# Patient Record
Sex: Female | Born: 1955 | Race: Black or African American | Hispanic: No | Marital: Single | Smoking: Former smoker
Health system: Southern US, Community
[De-identification: ages and names within clinical notes are randomized; demographics above are authoritative.]

## PROBLEM LIST (undated history)

## (undated) DIAGNOSIS — R51 Headache: Secondary | ICD-10-CM

## (undated) DIAGNOSIS — M8580 Other specified disorders of bone density and structure, unspecified site: Secondary | ICD-10-CM

## (undated) DIAGNOSIS — E7800 Pure hypercholesterolemia, unspecified: Secondary | ICD-10-CM

## (undated) DIAGNOSIS — I10 Essential (primary) hypertension: Secondary | ICD-10-CM

## (undated) HISTORY — DX: Headache: R51

## (undated) HISTORY — DX: Pure hypercholesterolemia, unspecified: E78.00

## (undated) HISTORY — PX: OOPHORECTOMY: SHX86

## (undated) HISTORY — DX: Essential (primary) hypertension: I10

## (undated) HISTORY — DX: Other specified disorders of bone density and structure, unspecified site: M85.80

---

## 1999-05-15 ENCOUNTER — Encounter: Payer: Self-pay | Admitting: Gynecology

## 1999-05-16 ENCOUNTER — Ambulatory Visit (HOSPITAL_COMMUNITY): Admission: RE | Admit: 1999-05-16 | Discharge: 1999-05-16 | Payer: Self-pay | Admitting: Gynecology

## 2000-12-31 ENCOUNTER — Encounter: Payer: Self-pay | Admitting: Family Medicine

## 2000-12-31 ENCOUNTER — Ambulatory Visit (HOSPITAL_COMMUNITY): Admission: RE | Admit: 2000-12-31 | Discharge: 2000-12-31 | Payer: Self-pay | Admitting: Family Medicine

## 2001-03-18 ENCOUNTER — Encounter: Payer: Self-pay | Admitting: Gynecology

## 2001-03-24 ENCOUNTER — Inpatient Hospital Stay (HOSPITAL_COMMUNITY): Admission: RE | Admit: 2001-03-24 | Discharge: 2001-03-27 | Payer: Self-pay | Admitting: Gynecology

## 2001-03-24 ENCOUNTER — Encounter (INDEPENDENT_AMBULATORY_CARE_PROVIDER_SITE_OTHER): Payer: Self-pay

## 2001-03-24 HISTORY — PX: TOTAL ABDOMINAL HYSTERECTOMY: SHX209

## 2001-04-15 ENCOUNTER — Ambulatory Visit (HOSPITAL_COMMUNITY): Admission: RE | Admit: 2001-04-15 | Discharge: 2001-04-15 | Payer: Self-pay | Admitting: Family Medicine

## 2001-04-15 ENCOUNTER — Encounter: Payer: Self-pay | Admitting: Family Medicine

## 2003-01-12 ENCOUNTER — Other Ambulatory Visit: Admission: RE | Admit: 2003-01-12 | Discharge: 2003-01-12 | Payer: Self-pay | Admitting: Gynecology

## 2004-05-09 ENCOUNTER — Other Ambulatory Visit: Admission: RE | Admit: 2004-05-09 | Discharge: 2004-05-09 | Payer: Self-pay | Admitting: Gynecology

## 2004-10-05 ENCOUNTER — Ambulatory Visit (HOSPITAL_COMMUNITY): Admission: RE | Admit: 2004-10-05 | Discharge: 2004-10-05 | Payer: Self-pay | Admitting: Neurology

## 2004-10-17 ENCOUNTER — Ambulatory Visit (HOSPITAL_COMMUNITY): Admission: RE | Admit: 2004-10-17 | Discharge: 2004-10-17 | Payer: Self-pay | Admitting: Neurology

## 2004-10-26 ENCOUNTER — Encounter (INDEPENDENT_AMBULATORY_CARE_PROVIDER_SITE_OTHER): Payer: Self-pay | Admitting: Cardiology

## 2004-10-26 ENCOUNTER — Ambulatory Visit (HOSPITAL_COMMUNITY): Admission: RE | Admit: 2004-10-26 | Discharge: 2004-10-26 | Payer: Self-pay | Admitting: Cardiology

## 2004-11-06 ENCOUNTER — Inpatient Hospital Stay (HOSPITAL_COMMUNITY): Admission: EM | Admit: 2004-11-06 | Discharge: 2004-11-09 | Payer: Self-pay | Admitting: Emergency Medicine

## 2004-11-20 ENCOUNTER — Encounter: Admission: RE | Admit: 2004-11-20 | Discharge: 2004-11-20 | Payer: Self-pay | Admitting: Neurology

## 2005-01-25 HISTORY — PX: CARDIAC SURGERY: SHX584

## 2005-10-17 ENCOUNTER — Other Ambulatory Visit: Admission: RE | Admit: 2005-10-17 | Discharge: 2005-10-17 | Payer: Self-pay | Admitting: Gynecology

## 2006-10-24 ENCOUNTER — Other Ambulatory Visit: Admission: RE | Admit: 2006-10-24 | Discharge: 2006-10-24 | Payer: Self-pay | Admitting: Gynecology

## 2007-03-06 ENCOUNTER — Inpatient Hospital Stay (HOSPITAL_COMMUNITY): Admission: EM | Admit: 2007-03-06 | Discharge: 2007-03-07 | Payer: Self-pay | Admitting: Emergency Medicine

## 2007-10-26 ENCOUNTER — Other Ambulatory Visit: Admission: RE | Admit: 2007-10-26 | Discharge: 2007-10-26 | Payer: Self-pay | Admitting: Gynecology

## 2008-04-24 ENCOUNTER — Emergency Department (HOSPITAL_COMMUNITY): Admission: EM | Admit: 2008-04-24 | Discharge: 2008-04-24 | Payer: Self-pay | Admitting: Emergency Medicine

## 2010-06-13 ENCOUNTER — Ambulatory Visit: Payer: Self-pay | Admitting: Women's Health

## 2010-06-13 ENCOUNTER — Other Ambulatory Visit: Admission: RE | Admit: 2010-06-13 | Discharge: 2010-06-13 | Payer: Self-pay | Admitting: Gynecology

## 2011-03-25 ENCOUNTER — Ambulatory Visit (INDEPENDENT_AMBULATORY_CARE_PROVIDER_SITE_OTHER): Payer: Managed Care, Other (non HMO) | Admitting: Women's Health

## 2011-03-25 DIAGNOSIS — B373 Candidiasis of vulva and vagina: Secondary | ICD-10-CM

## 2011-03-25 DIAGNOSIS — N898 Other specified noninflammatory disorders of vagina: Secondary | ICD-10-CM

## 2011-04-26 NOTE — Op Note (Signed)
[facility]  Patient:    Melissa Duran Visit Number: [id number] MRN: [id number]          Service Type: DSU Location: [facility] Attending Physician:  Tonye Royalty Proc. Date: 05/16/99 Admit Date:  05/16/1999                             Operative Report  PREOPERATIVE DIAGNOSIS:       Request for elective permanent sterilization.  POSTOPERATIVE DIAGNOSIS:      Request for elective permanent sterilization.  OPERATION:                    Laparoscopic tubal ligation, Hulka clip technique.  SURGEON:                      Juan H. Lily Peer, M.D.  ASSISTANT:  ANESTHESIA:                   General endotracheal anesthesia.  FINDINGS:                     Several intramural and subserosal leiomyomas were  noted.  Normal tubes and ovaries.  ESTIMATED BLOOD LOSS:  INDICATIONS:                  A 55 year old, gravida 1, para 1, with request for elective permanent sterilization.  The patient was previously provided with literature information from the [employer] of OB/GYN on laparoscopic sterilization procedure.  Pros and cons, risks, and failure rates were discussed and all questions have been answered.  The patient understood that this was a permanent sterilization and all questions were answered.  DESCRIPTION OF PROCEDURE:     After the patient was adequately counseled, she was taken to the operating room where the patient underwent a successful general endotracheal anesthesia placement.  She was placed in the low lithotomy position and the abdomen, vagina, and perineum were prepped and draped in the usual sterile fashion.  A red rubber Roxan Hockey had previously been inserted to empty her bladder for approximately 50 cc.  An examination under anesthesia demonstrated a slightly irregular uterus, but was anteverted.  A Hulka tenaculum was placed for manipulation during laparoscopic procedure.  After this, a small stab  incision as made in the subumbilical region followed by insertion of the Veress needle. Opening intra-abdominal pressure was 2 mmHg and approximately 3 liters of carbon dioxide were insufflated into the peritoneal cavity.  After this, the Veress needle was removed.  A 10 mm trocar was then inserted into the abdominal cavity under laparoscopic visualization.  A 5 mm trocar was inserted 2 cm above the symphysis pubis whereby a self-retaining retractor was introduced.  After a systematic inspection of the pelvic cavity with above mentioned findings, attention was then placed in the distal portion of the right fallopian tube and the proximal 1/3 portion was identified and a Hulka clip was placed to completely occlude a segment of the fallopian tube.  A similar procedure was carried out on the contralateral side without any complications.  The liver appeared smooth.  No perihepatic adhesions were noted.  The appendix was not identified, possibly because it could have been retrocecal.  The CO2 was removed from the intra-abdominal cavity. The instruments were then removed.  The 10 mm trocar site fascia was reapproximated  with a running stitch of 3-0 Vicryl suture.  The skin was  reapproximated with a  subcuticular stitch of 4-0 plain catgut suture and steri-stripped.  The 5 mm trocar site incision was closed with interrupted sutures of 4-0 plain catgut suture. or postoperative analgesia 0.25% Marcaine was infiltrated in both incision sites for a total of 10 cc.  The Hulka tenaculum was removed and the patient was transferred to the recovery room with stable vital signs.  Estimated blood loss was minimal. Fluid resuscitation consisted of 1200 cc of Ringers lactate. Attending Physician:  Tonye Royalty DD:  05/16/99 TD:  05/16/99 Job: [id number]

## 2011-04-26 NOTE — Discharge Summary (Signed)
[facility]  Patient:    Melissa Duran, Melissa Duran                      MRN: [id number] Adm. Date:  [id number] Disc. Date: [id number] Attending:  Tonye Royalty                           Discharge Summary  HISTORY:                      The patient is a 55 year old gravida 1, para 1 who on the morning of April 16 underwent a total abdominal hysterectomy with right salpingo-oophorectomy and pelvic adhesiolysis secondary to leiomyomatous uteri and dysmenorrhea and persistent right ovarian cyst.  Pathology report demonstrated no abnormalities of the cervix.  She had a benign proliferative endometrium.  She had a leiomyomatous uteri and focal adenomyosis.  The right ovary had an endometriotic cyst and fallopian tube was otherwise unremarkable. Her surgery went uneventful.  She had a blood loss of 300 cc.  She had received 1 g of Cefotan for prophylaxis and also pneumatic compression stockings for DVT prophylaxis as well.  Her postoperative day #1 her temperature max was 99.5 and her vital signs were stable.  Urine output was adequate.  Hemoglobin and hematocrit were 10.9 and 32.3 respectively with a platelet count of 387,000.  She had good bowel sounds.  Her incision was intact.  Her Foley catheter was discontinued as well as her PCA pump and she was started on Percocet orally and started on clear liquids and started to ambulate.  On her second postoperative day her temperature max was 99.1 and then was 98.4.  Her blood pressure was fine.  She reinitiated her Norvasc due to her history of hypertension and started taking 10 mg q.d.  She has now been ambulating, tolerating clear liquids well and later that day she was advanced to a regular diet.  She was given Dulcolax suppositories.  Took a shower that afternoon.  She was discharged on her third postoperative day with stable vital signs.  She was afebrile.  She was tolerating a regular diet, had passed gas, had  good bowel sounds.  Her incision was intact and staples were removed and she was Steri-Stripped and she was ready for discharge home.  FINAL DIAGNOSES:              1. Leiomyomatous uteri.                               2. Adenomyosis.                               3. Right endometriotic cyst.  PROCEDURE:                    1. Pelvic adhesiolysis.                               2. Total abdominal hysterectomy with right                                  salpingo-oophorectomy.  DISPOSITION:  Patient was discharged home on her third postoperative day.  She was given a prescription for Percocet to take one to two tabs p.o. q.4-6h. p.r.n. pain.  She is to continue her Norvasc 10 mg p.o. q.d.  She was to follow-up in three weeks to the office for postoperative visit.  Postoperative instruction sheet was provided as well. DD:  04/15/01 TD:  04/15/01 Job: [id number]

## 2011-04-26 NOTE — H&P (Signed)
[facility]  Patient:    Melissa Duran, Melissa Duran                        MRN: [id number] Adm. Date:  03/24/01 Attending:  Gaetano Hawthorne. Lily Peer, M.D.                         History and Physical  CHIEF COMPLAINT: 1. Leiomyomatous uteri. 2. Dysmenorrhea. 3. Persistent right ovarian cyst.  HISTORY:  Patient is a 55 year old gravida 1, para 1 who for the past year has been followed closely with an enlarged uterus which measures approximately 12 weeks size with multiple leiomyomata.  During that time period, she has been followed for a right ovarian cyst, which initially she was placed on Loestrin 1/20 in an effort to see if this cyst would resolve by ovarian ovulation suppression.  It has not changed and seems to have the persistent right ovarian cyst complex measuring 2.1 x 1.4 x 1.6 cm, with fine low-level echoes noted throughout the lumen.  A few areas of slight increased echogenicity were also noted in the lumen.  The left adnexa was reported to be normal and the cul-de-sac was negative and she did have a C125 in November of 2001 which was normal.  Patient also has suffered from dysmenorrhea and heavier periods and has been offered laparoscopic ovarian cystectomy versus right ovarian oophorectomy and she decided, due to her dysmenorrhea and menorrhagia and the persistent ovarian cyst, to proceed with abdominal hysterectomy and right salpingo-oophorectomy, with all efforts to conserve the contralateral ovary.  PAST MEDICAL HISTORY:  She had a normal spontaneous vaginal delivery in 1993. She has hypertension for which she is on Norvasc 10 mg q.d.  She has been followed by her internist, ______ , and she had also been taking Loestrin 1/20, as mentioned above.  She had a bilateral tubal sterilization in June of 2000.  She denies any allergies.  Her last mammogram was on March 2nd.  She had two small cysts in the right breast and was directed to follow up in  four months for a followup diagnostic mammogram and ultrasound, which will be scheduled in July.  Her recent Pap smear in September of 2001 was normal also.  FAMILY HISTORY:  Patient as well as sister and mother with history of hypertension and sister had cardiovascular disease and eventually succumb to cardiac arrest.  Patients menarche was age 7.  SOCIAL HISTORY:  Denies alcohol consumption or cigarette smoking.  PHYSICAL EXAMINATION:  GENERAL:  Well-developed, well-nourished female.  HEENT:  Unremarkable.  NECK:  Neck supple.  Trachea midline.  No carotid bruit or thyromegaly.  LUNGS:  Lungs clear to auscultation without rhonchi or wheezes.  HEART:  Heart regular rate and rhythm without any murmurs or gallops.  BREASTS:  Breast examination was done during her annual examination in August of 2001 which was reported to be normal.  ABDOMEN:  Abdomen soft, nontender, without rebound or guarding.  PELVIC:  Bartholins, urethra and Skene glands within normal limits.  Vagina and cervix with no gross lesions on inspection.  Uterus 10 to 12 weeks size, irregular-shaped uterus.  Adnexa difficult to evaluate; see report of ultrasound above.  RECTAL:  Rectal exam confirmatory.  ASSESSMENT:  [age]-year-old gravida 1, para 1 with menorrhagia, dysmenorrhea and leiomyomatous uteri and persistent right ovarian cyst, is scheduled to undergo a total abdominal hysterectomy with right salpingo-oophorectomy on the morning of April 16th.  The  risks, benefits, pros and cons of the operation to include infection, bleeding, trauma to internal organs requiring corrective surgery as to the bladder, intestine, nerves, vessels and nearby structures were discussed.  It was also stated to her that in case of uncontrollable hemorrhage, that she would need to receive a blood transfusion.  The risks of potential transfusion to include hepatitis, acquired immunodeficiency syndrome and anaphylactic  reaction were discussed in detail.  She will also have pneumatic compression stockings to prevent deep venous thrombosis.  She also has given authorization that in the event that both ovaries do not look good, to go ahead and proceed with removal.  She is aware that if this is undertaken, that she will need to be on hormone-replacement therapy for the remainder of her life.  Literature information from the [employer] of Obstetrics and Gynecology on hysterectomy had been provided.  Dr. Jonny Ruiz T. Soper, gynecologic oncologist, will be on standby in the case that the frozen specimen appears to be malignant. DD:  03/23/01 TD:  03/24/01 Job: [id number]

## 2011-04-26 NOTE — Discharge Summary (Signed)
NAMEKELCE, BOUTON               ACCOUNT NO.:  [id number]   MEDICAL RECORD NO.:  [id number]          PATIENT TYPE:  INP   LOCATION:  3031                         FACILITY:  [facility]   PHYSICIAN:  Pramod P. Pearlean Brownie, MD    DATE OF BIRTH:  10/03/1956   DATE OF ADMISSION:  11/06/2004  DATE OF DISCHARGE:  11/09/2004                                 DISCHARGE SUMMARY   DISCHARGE DIAGNOSES:  1.  Transient vertigo and numbness, question stroke versus multiple      sclerosis, versus vasculitis.  2.  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  3.  Hypertension.  4.  History of migraines.  5.  Remote history of bleeding gastrointestinal ulcer in 1997, but no      recurrent symptoms since.  6.  Tubal ligation.   DISCHARGE MEDICATIONS:  1.  Norvasc 10 mg daily.  2.  Coumadin 7.5 mg daily, then dose as needed.  3.  Lovenox 70 mg q.12h. x4 more doses.   STUDIES PERFORMED:  1.  CT of the brain showed patchy white matter, low densities in both      cerebral hemispheres corresponding to white matter abnormality, on an      old MRI.  No acute abnormality.  2.  MRI of the brain showed no acute infarction or enhancing intracranial      lesion.  There are persistent abnormal white matter-type changes,      punctate white matter-type changes throughout the supratentorial region,      including the subcortical and periventricular areas.  3.  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  4.  Electrocardiogram had a normal sinus rhythm.   LABORATORY DATA:  INR 1.3 on the day of discharge.  CBC normal.  T-hep cell  2150, normal, with percentage of 62, which is elevated.  Sedimentation rate  normal.  Compliment-C3 normal.  RPR nonreactive.  CBC normal except for a  hemoglobin  ranging from 10.3 to 11.3.  Chemistry normal.  Compliment CH-50  pending.  ANA negative.  SSA and SSB both pending.  Angiotensin-converting  enzyme normal.  Lyme disease negative.   HISTORY OF PRESENT ILLNESS:  The patient is  a 55 year old, right-handed  black female who has recently discovered an inter-cardiac defect, who was  seen by Dr. Janalyn Shy P. Sethi in the office just one month ago for three  episodes of amaurosis fugax in the right eye.  Her workup included  outpatient studies, including carotid Doppler which showed at that time  frequent emboli of the right carotid.  She had an MRI scan that showed  bilateral white matter disease, but no acute infarction.  One of the  interpretations given was possible MS, and she has had multiple CT's and  MRI's in the past, watching for MS.  She was  started no aspirin and Plavix,  but subsequently had another event on the left when she returned for a  bubble study, and was found to have evidence of an inter-cardiac defect,  which appears to be quite large, with right to left shunt.  As a result, she  was referred to have a transesophageal echocardiogram performed by Dr. Cristy Hilts. Ganji, which confirmed this large shunt.  She has been maintained on  aspirin and Plavix, with the plan for Dr. Jacinto Halim to close the defect when he  returns to town in January.  On the morning of this admission she awoke with severe vertigo and nausea,  although no vomiting.  She does not feel like she has a stuttering gait or  any incoordination.  She was brought to the emergency room and was admitted  for further workup.   HOSPITAL COURSE:  An MRI was negative for an acute infarction, though MS-  appearing plaques still remain.  The decision was made that since she failed  aspirin and Plavix therapy, to change her to Coumadin until inter-cardiac  shunt can be replaced.  It is still unclear what the actual etiology of  these deficits are.  More suspicion now at this  point for MS or MS-like  syndrome than stroke-like symptoms, as the patient has had multiple emboli,  but no acute infarction.   FOLLOWUP:  Follow-up outpatient testing will be arranged, to evaluate for  MS, and the patient will be kept on Coumadin until Dr. Jacinto Halim can perform a  closure.   CONDITION ON DISCHARGE:  Neurologically normal.   DISCHARGE PLAN:  1.  Discharge home on Coumadin, with Lovenox for bridge.  Will have the INR      checked on Monday, with the results called to Dr. Verl Dicker office.  He      will manage her Coumadin level until the procedure.  2.  Follow up with Dr. Pearlean Brownie in two to three months.  3.  He will also schedule an outpatient workup for MS, and will call her      with the appointment dates and times.       SB/MEDQ  D:  11/09/2004  T:  11/11/2004  Job:  [id number]   cc:   Cristy Hilts. Jacinto Halim, MD  [address]  [place]  [place] [address]  Fax: [phone]   [facility]

## 2011-04-26 NOTE — H&P (Signed)
NAMETHORA, SCHERMAN NO.:  [id number]   MEDICAL RECORD NO.:  [id number]          PATIENT TYPE:  INP   LOCATION:  1833                         FACILITY:  [facility]   PHYSICIAN:  Gustavus Messing. Orlin Hilding, M.D.DATE OF BIRTH:  [birth year]   DATE OF ADMISSION:  11/06/2004  DATE OF DISCHARGE:                                HISTORY & PHYSICAL   CHIEF COMPLAINT:  Vertigo.   HISTORY OF PRESENT ILLNESS:  Melissa Duran is a 55 year old right-handed  black woman with a recently-discovered intracardiac defect seen by me in the  office on October 05, 2004, just a month ago, for three episodes of  amaurosis fugax in the right eye.  Her work-up included outpatient studies,  including transcranial Doppler, which showed at that time frequent emboli of  the right carotid.  She had MRI scan of the brain that showed bilateral  white matter disease.  One of the interpretations given was possibly MS,  although, as events turned out, this is bilateral cardiac emboli, not MS.  MRA of the vascular tree from the arch to the intracranial vessels was  negative.  There was no stenosis or identifiable clot.  She was started on  Plavix and aspirin, and subsequently had another event on the left, when she  returned for a bubble study, and was found to have evidence of an  intracardiac defect, which appeared to be quite large, with right to left  shunt.  As a result of that, she was referred to a transesophageal  echocardiogram, saw Dr. Jacinto Halim, and this confirmed a large right to left  shunt.  She was maintained on the Plavix and aspirin, and the plan had been  for endovascular closure of the cardiac defect, although Dr. Jacinto Halim is going  to be out of town until January, so that cannot happen now for at least 4  weeks.  This morning, she woke up with severe vertigo and nausea, although  no vomiting.  She has not had a problem with this before.  She does not feel  like she is staggering or that she has any  incoordination.   REVIEW OF SYSTEMS:  Negative for any chest pain or shortness of breath.  No  recurrent visual symptoms.  She does have occasional borborygmi, for which  she takes Questran on a p.r.n. basis.   PAST MEDICAL HISTORY:  1.  Significant for this newly-diagnosed intracardiac defect with right to      left shunt with bilateral emboli to the brain with episodes of amaurosis      on both sides.  2.  Hypertension.  3.  History of migraines.  4.  Remote history of a bleeding GI ulcer in 1997, but no recurrent symptoms      since then.  5.  Tubal ligation.   MEDICATIONS:  1.  Norvasc 10 mg one daily.  2.  Questran p.r.n.  3.  Plavix 75 mg one daily.  4.  Aspirin 325 mg one daily.   ALLERGIES:  No known drug allergies.   SOCIAL HISTORY:  She is divorced.  Lives with her 36 year old  son.  She does  drink some caffeinated beverages.  Quit smoking in 1991.  Drinks a glass of  wine occasionally.  No recreational drug use.  Two years of post high school  education.  Works as a Nurse, children's.   FAMILY HISTORY:  Positive for strokes, hypertension, thyroid disease, and  migraines.   PHYSICAL EXAMINATION:  VITAL SIGNS:  Stable.  HEENT:  Head is normocephalic and atraumatic.  NECK:  Supple without bruits.  LUNGS:  Clear to auscultation.  HEART:  Regular rate and rhythm.  NEUROLOGIC:  She is awake and alert.  Normal language.  Fully oriented.  Cranial nerves - pupils are equal and reactive.  Visual fields are full.  Extraocular movements are intact.  Facial sensation is normal.  Facial motor  activity is normal.  Hearing is intact.  Palate symmetric.  Tongue is  midline.  Motor exam - she has no drift, no satelliting, normal bulk, tone,  and strength throughout.  No fasciculations, atrophy, or tremor.  Reflexes  are 1+ and symmetric with downgoing toes.  Coordination - finger-to-nose,  rapid alternating movements, heel-to-shin are normal.  She has a normal  gait.  Sensory  exam is intact.   ASSESSMENT:  Vertigo.  Possibly this represents a transient ischemic attack  or stroke in the setting of known intracardiac defect with right to left  shunt and active emboli by TCD, symptomatic on Plavix and aspirin only.   PLAN:  Will admit her to begin heparin and Coumadin, as it will be greater  than 1 month until she can have a definitive procedure for closing this.  Will check an MRI to see whether or not she has actually had an acute stroke  at this time.      Cath   CAW/MEDQ  D:  11/06/2004  T:  11/06/2004  Job:  [id number]

## 2011-04-26 NOTE — Consult Note (Signed)
NAMETALLY, MATTOX NO.:  [id number]   MEDICAL RECORD NO.:  [id number]          PATIENT TYPE:  INP   LOCATION:  3037                         FACILITY:  [facility]   PHYSICIAN:  Deanna Artis. Hickling, M.D.DATE OF BIRTH:  07-13-1956   DATE OF CONSULTATION:  03/06/2007  DATE OF DISCHARGE:  03/07/2007                                 CONSULTATION   CHIEF COMPLAINT:  Shadow in the left eye.   HISTORY OF THE PRESENT CONDITION:  That is 55 year old right-handed  African-American woman who had a prior history of amaurosis fugax in the  right eye x3, the left eye x1 and then presented with vertigo and nausea  in December2005.  She was admitted to the hospital.  She had a known  large right to left intracardiac shunt at the level of the foramen  ovale.  There was evidence based on bubble studies to show that there  were bilateral cerebral emboli.   The patient was discharged home after an extensive workup and later had  her patent foramen ovale closed at [facility].   Today, the patient had the onset of blurred vision in the left eye like  a curtain and came over it.  This resolved.  She presented to [facility] at 1505.  The history was obtained by the triage nurse at  1641.  CT scan was ordered at 1949.  Call was placed to me at 2200.  I  recommended that the primary MD admit and agreed to consult on this  patient.  I believe that she should be admitted to evaluate TIA  symptoms.   This episode was somewhat different from previous episodes in that she  had a frontal headache that became holosystolic, so was able to be  treated with Tylenol with good resolution.   The patient's risk factors for stroke include hypertension, migraines  and dyslipidemia.  She has had appropriate secondary stroke prevention  and at this point does not show evidence of hypertension.  Remains it to  be seen whether the other risk factors have been treated and  controlled.   CURRENT MEDICATIONS:  1. Lotrel.  2. Crestor.  3. Triamterene.  4. Aggrenox.   DRUG ALLERGIES:  None known.   PAST MEDICAL HISTORY:  Remote TIA ulcer in 1997.   PAST SURGICAL HISTORY:  1. Tubal ligation.  2. Patent foramen ovale closure.   FAMILY HISTORY:  Positive for stroke, hypertension, thyroid disease,  migraine.   SOCIAL HISTORY:  The patient is divorced.  She lives her 21 year old  son.  She stopped smoking in 1991.  She drinks occasional wine.  She  does not use recreational drugs.  She works as a Primary school teacher.  She  has had high school plus two years.   PHYSICAL EXAMINATION:  Tonight, blood pressure 112/74, resting pulse 90,  respirations 18, temperature 97.8.  Oxygen saturation 100%.  NIH stroke scale equals 1, modified Rankin scale equals 0.  EAR, NOSE AND THROAT:  No infections.  No bruits.  LUNGS:  Clear.  HEART:  No murmurs.  Pulses normal.  ABDOMEN:  Soft.  Bowel sounds normal.  No hepatosplenomegaly.  EXTREMITIES:  Were normal.  NEUROLOGIC EXAMINATION:  The patient was awake, alert without dysphasia.  Cranial nerves:  Round reactive pupils.  Fundi were normal.  No  Hollenhorst plaques.  Normal vision.  Visual fields full.  Extraocular  movements full.  Symmetric facial strength.  Midline tongue and uvula.  Air conduction greater than bone conduction bilaterally.   Motor examination:  The patient had normal strength.  No drift.  Fine  motor movements were normal.  Sensation intact to primary and cortical  modalities, including double simultaneous stimuli and stereognosis.  Cerebellar examination:  Good finger-to-nose, heel-knee-shin; rapid  repetitive movements were okay.  Gait was normal.  Deep tendon reflexes  were normal except at the ankles which were diminished.  The patient had  bilateral flexor plantar responses.   IMPRESSION:  Monocular visual fields loss with headache.  Amaurosis  fugax needs be considered; so does migraine with  aura.   PLAN:  Admit the patient to the hospital.  She will have an MRI brain,  MRA intracranial and extracranial, hemoglobin A1c, fasting lipid panel  and homocysteine.  As an outpatient, she will need a 2-D echocardiogram  and possibly a transesophageal echocardiogram as well as perhaps a  bubble study.   PLAN:  Continue Aggrenox; I would not change this.  She has been on  aspirin and Plavix before and continued to have her vascular events.  She has done quite well over the last two years.  I am not certain that  this represents stroke.  If she has, we will need to modify risk factors  and complete workup for treatable causes of stroke before considering  coumadin.  The patient passed her swallowing study so that she can eat.  She is a nonsmoker for 17 years and so does not need counseling.  She  had TIA and therefore would not qualify for t-PA.      Deanna Artis. Sharene Skeans, M.D.  Electronically Signed     [facility]/MEDQ  D:  03/06/2007  T:  03/07/2007  Job:  [id number]   cc:   Hal T. Stoneking, M.D.

## 2011-04-26 NOTE — Op Note (Signed)
[facility]  Patient:    Melissa Duran, Melissa Duran                      MRN: [id number] Proc. Date: 03/24/01 Adm. Date:  [id number] Attending:  Tonye Royalty                           Operative Report  PREOPERATIVE DIAGNOSES: 1. Symptomatic leiomyomatous uteri. 2. Dysmenorrhea. 3. Menorrhagia. 4. Persistent right ovarian cyst.  POSTOPERATIVE DIAGNOSES: 1. Symptomatic leiomyomatous uteri. 2. Dysmenorrhea. 3. Menorrhagia. 4. Persistent right ovarian cyst. 5. Pelvic adhesions.  OPERATION: 1. Pelvic adhesiolysis. 2. Total abdominal hysterectomy with right salpingo-oophorectomy.  SURGEON:  Juan H. Lily Peer, M.D.  FIRST ASSISTANT:  Katy Fitch, M.D.  INDICATIONS:  [age]year-old gravida 1, para 1 with persistent right ovarian cyst along with symptomatic leiomyomata uteri contributing to anemia, dysmenorrhea and menorrhagia.  The C-125 done January 03, 2000, was normal.  FINDINGS:  Patient with right ovary encased in the adhesions to the posterior aspect of the uterus which appears to be an endometriotic implant.  Also, the patient had multiple intramural leiomyomas of various sizes and approximately 12 week size uterus.  Also, there was evidence of previous bilateral tubal sterilization procedure with Hulka clips appropriately placed.  The contralateral ovary was normal.  There was no other evidence of endometriosis in the remainder of the cul-de-sacs.  The rest of the pelvic cavity was unremarkable.  DESCRIPTION OF PROCEDURE:  After the patient was adequately counseled, she was taken to the operating room where she successfully underwent a general endotracheal anesthesia.  She had received 1 g of Cefotan preoperatively as well as she had pneumatic compression stockings in an effort to prevent DVT. Once taken to the operating room, she was in the supine position.  The abdomen was prepped and draped in the usual sterile fashion, and  a Foley catheter had been inserted in an effort to monitor urinary output.  A Pfannenstiel skin incision was made 2 cm above the symphysis pubis.  The incision was carried down through the skin, subcutaneous tissue down to the rectus fascia where a midline nick was made, and the fascia was incised in a transverse fashion. The peritoneal cavity was entered cautiously, and the patient was placed in slight Trendelenburg position, and the OConnor-OSullivan retractors were placed for exposure.  It was at this point that the above mentioned findings were noted, and with meticulous dissection pelvic adhesiolysis was accomplished in an effort to free the right ovary to the right pelvic side wall.  The right round ligament was identified and was suture ligated with 0 Vicryl suture, transected, and the anterior broad ligament was incised to the level anterior to the internal cervical os.  The right ureter was identified and the posterior broad ligament was penetrated with the surgeons finger and the right infundibulopelvic ligament was clamped, cut and suture ligated x 2, and the right ovary and tube were removed.  It was opened on the operating room table and chocolate cyst was evident and thus admitted for frozen section based on the findings of an endometrioma.  After this, skeletonization of the right parametrial area was accomplished, and attention was then placed to the contralateral side whereby the left round ligament was suture ligated with 0 Vicryl suture and was transected and the anterior leaf of the broad ligament was incised to the level of the internal cervical os.  The left ureter  was identified and with the surgeons fingers, the posterior broad ligament was penetrated and the Heaney clamp was placed close to the uterus in an effort to leave the left tube and ovary.  The pedicle was free tied with 0 Vicryl suture followed by a transfixation stitch of 0 Vicryl suture.  The remainder of  the parametrium was skeletonized and the uterine arteries for both sides were clamped, cut and suture ligated with 0 Vicryl suture, and the remainder of the broad ligament and cardinal ligaments were serially clamped, cut and suture ligated to the level of the lateral fornices.  Once the bladder was brought away from the cervix, two curved Heaney clamps were placed underneath the cervix and with the use of the knife was incised and both of these became the pedicles and angle stitches which were secured and with curved Mayo scissors the remaining cervix was removed from the fornices intact.  The remainder of the vaginal cuff was reapproximated with a running locked stitch of 0 Vicryl suture.  After this, the pelvic cavity was copiously irrigated with normal saline solution.  Inspection of the whole cavity demonstrated adequate hemostasis.  A left oophoropexy was accomplished by securing the ovary to the pedicle of the left ovary to the left round ligament.  This was secured with 3-0 Vicryl suture.  After reinspection of the pelvic cavity demonstrated adequate hemostasis, the sponges were removed.  The OConnor-OSullivan retractor was removed also.  The sponge count and needle count were reported to be correct.  The visceral peritoneum was closed with a running stitch of 3-0 Vicryl suture. The rectus fascia was closed with a running stitch of 3-0 Vicryl suture. The subcutaneous bleeders were Bovie cauterized, and the skin was reapproximated with skin clips followed by placing Xeroform gauze and 4 x 8 dressing.  The patient was extubated and transferred to the recovery room with stable vital signs.  Blood loss for the procedure was 150 cc.  Urine output was 300 cc.  IV fluids were 2000 cc of lactated Ringers. DD:  03/24/01 TD:  03/24/01 Job: [id number]

## 2011-04-26 NOTE — H&P (Signed)
NAME:  Melissa Duran, Melissa Duran               ACCOUNT NO.:  [id number]   MEDICAL RECORD NO.:  [id number]          PATIENT TYPE:  EMS   LOCATION:  MAJO                         FACILITY:  [facility]   PHYSICIAN:  Hal T. Stoneking, M.D. DATE OF BIRTH:  1956-03-15   DATE OF ADMISSION:  03/06/2007  DATE OF DISCHARGE:                              HISTORY & PHYSICAL   IDENTIFYING DATA:  Mrs. Melissa Duran is a delightful 55 year old black female  who has a past history of a patent foramen ovale which was repaired at  [facility] a couple of years ago.  She had presented at that time with at  least three episodes of right amaurosis fugax.  She was placed on  Aggrenox after her procedure and somewhere along the line had reduced  from twice a day to once a day.  In any event she was doing well until  this afternoon at work.  She noted a dark shade coming down over her  left visual field.  She could still see, but it was definitely dark.  This then was followed by headache and eventually the vision cleared.  The headache has started in frontal area radiating back into the  occipital area.  She had no trouble with speech.  She had no numbness or  tingling.  No weakness in her arms and legs.   ALLERGIES:  She has no known drug allergies.   CURRENT MEDICATIONS:  1. Aggrenox one p.o. daily.  2. Crestor 10 mg a day.  3. Triamterene hydrochlorothiazide 37.5/25 one half tablet a day.  4. Generic Lotrel 10/20 mg a day.   PAST MEDICAL HISTORY:  1. Remarkable for PFO.  2. History of hypertension.  3. History of migraines.  4. History of bleeding gastric ulcer in 1997.  5. History of hypercholesterolemia.   PAST SURGICAL HISTORY:  1. She had tubal ligation.  2. She had a tubal ligation.  3. She has had a PFO repair.   SOCIAL HISTORY:  She is divorced.  She lives with her 24 year old son.  Stopped smoking in 1991.  Does not consume alcohol or use any illicit  drugs.  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   FAMILY  HISTORY:  Family history is remarkable for stroke, hypertension  and thyroid disease.   REVIEW OF SYSTEMS:  Does complain of a frontal headache currently.  No  change in vision or hearing.  No cough, no chest pain.  No abdominal  pain.  No change in bowel or urinary habits.   PHYSICAL EXAMINATION:  VITAL SIGNS:  Temperature 97.8, blood pressure  112/74, pulse rate 90,  respiratory rate 18, oxygen saturation 100%.  HEENT: Pupils equal, round, reactive to light.  Extraocular muscles  intact.  No specific findings on funduscopic.  Oropharynx normal.  NECK:  No JVD or thyromegaly.  LUNGS:  Clear.  HEART:  Regular rate and rhythm without murmur.  ABDOMEN:  Soft.  No masses felt.  NEUROLOGIC:  She is alert and oriented x3.  She had no carotid bruit.  Cranial nerves II-XII are intact.  Motor exam reveals 5/5 grip strength  bilaterally, 5/5 dorsiflexion bilaterally.  Sensation intact to light  touch.   LABORATORY DATA:  EKG shows normal sinus rhythm, no acute changes.  Sodium 139, potassium 3.8, chloride 107, BUN 18, creatinine 1, glucose  elevated at 153, hemoglobin 11.6, protime INR 1.   ASSESSMENT:  1. Classic history of TIA with amaurosis fugax in opposite eye from      her previous episodes.  Dr. Sharene Skeans evaluated her with  neurology.      MRI and MRA pending.  Will increase the Aggrenox back to b.i.d.      dosage and may need to consider a TEE if MRI and MRA are      unrevealing.  2. Hypertension, good control.  3. Elevated random glucose.  Will obtain a repeat glucose in the      morning and a hemoglobin A1C.           ______________________________  Hal T. Pete Glatter, M.D.     HTS/MEDQ  D:  03/06/2007  T:  03/07/2007  Job:  [id number]   cc:   Holley Bouche, M.D.

## 2011-04-26 NOTE — Discharge Summary (Signed)
Melissa Duran, Melissa Duran NO.:  [id number]   MEDICAL RECORD NO.:  [id number]          PATIENT TYPE:  INP   LOCATION:  3037                         FACILITY:  [facility]   PHYSICIAN:  Barnetta Chapel, MDDATE OF BIRTH:  06/20/1956   DATE OF ADMISSION:  03/06/2007  DATE OF DISCHARGE:  03/07/2007                               DISCHARGE SUMMARY   PRIMARY CARE PHYSICIAN:  Holley Bouche, M.D.   ADMITTING DIAGNOSES:  1. Transient ischemic attack with amaurosis fugax.  2. Hypertension, well controlled.  3. Elevated random glucose.   DISCHARGE DIAGNOSIS:  Amaurosis fugax (suspect transient ischemic  attack).   DISCHARGE MEDICATIONS:  1. Aggrenox one cap p.o. twice daily.  2. Crestor 10 mg p.o. once daily.  3. The patient's antihypertensive (Lotrel 10/20) one cap p.o. once      daily and the triamterene/HCTZ (37.5/25) one half tab once daily      have been held because the patient's blood pressure was running in      the 90s.   CONSULTATIONS:  The patient was seen by Dr. Sharene Skeans, neurologist.   BRIEF HISTORY AND HOSPITAL COURSE:  Please refer to the H&P done by Dr.  Pete Glatter on March 06, 2007.  The patient is a 55 year old female with  past medical history significant for PFO, hypertension, migraines and  dyslipidemia.  The patient also carries diagnosis of TIA and amaurosis  fugax.  The patient presented with another episode of amaurosis fugax on  the left eye.  The patient was admitted for further workup.  She was  seen by the neurologist, Dr. Sharene Skeans.  She had an MRI of the brain.  Dr. Sharene Skeans called the nurse informing her that the MRI of the brain  was normal and the patient could be discharged back home.  During her  stay in the hospital, the systolic blood pressure was running from 88-99-  mmHg (the systolic blood pressure).  The patient's anti-hypertensives  have been held.  The Aggrenox has been increased to one cap twice daily.  The patient will be  discharged back home today.   DISCHARGE PLANS:  1. Discharge the patient back home today.  2. Aggrenox one cap p.o. twice daily.  3. Blood pressure meds have been held.  4. The patient is to check her blood pressure again in two days in her      primary care Halynn Reitano's office.  5. The patient should follow with the primary care Brannan Cassedy in a week.      Barnetta Chapel, MD  Electronically Signed     SIO/MEDQ  D:  03/07/2007  T:  03/07/2007  Job:  [id number]   cc:   Holley Bouche, M.D.

## 2011-05-02 ENCOUNTER — Ambulatory Visit (INDEPENDENT_AMBULATORY_CARE_PROVIDER_SITE_OTHER): Payer: Managed Care, Other (non HMO) | Admitting: Women's Health

## 2011-05-02 DIAGNOSIS — R82998 Other abnormal findings in urine: Secondary | ICD-10-CM

## 2011-05-02 DIAGNOSIS — N390 Urinary tract infection, site not specified: Secondary | ICD-10-CM

## 2011-05-02 DIAGNOSIS — N952 Postmenopausal atrophic vaginitis: Secondary | ICD-10-CM

## 2011-05-02 DIAGNOSIS — B373 Candidiasis of vulva and vagina: Secondary | ICD-10-CM

## 2011-08-08 ENCOUNTER — Encounter: Payer: Self-pay | Admitting: Women's Health

## 2011-08-08 ENCOUNTER — Ambulatory Visit (INDEPENDENT_AMBULATORY_CARE_PROVIDER_SITE_OTHER): Payer: Managed Care, Other (non HMO) | Admitting: Women's Health

## 2011-08-08 ENCOUNTER — Other Ambulatory Visit (HOSPITAL_COMMUNITY)
Admission: RE | Admit: 2011-08-08 | Discharge: 2011-08-08 | Disposition: A | Discharge status: Home / Self Care | Payer: Managed Care, Other (non HMO) | Source: Ambulatory Visit | Attending: Women's Health | Admitting: Women's Health

## 2011-08-08 VITALS — BP 130/70 | Ht 60.5 in | Wt 156.0 lb

## 2011-08-08 DIAGNOSIS — Z01419 Encounter for gynecological examination (general) (routine) without abnormal findings: Secondary | ICD-10-CM

## 2011-08-08 DIAGNOSIS — N898 Other specified noninflammatory disorders of vagina: Secondary | ICD-10-CM

## 2011-08-08 DIAGNOSIS — B3731 Acute candidiasis of vulva and vagina: Secondary | ICD-10-CM

## 2011-08-08 DIAGNOSIS — B373 Candidiasis of vulva and vagina: Secondary | ICD-10-CM

## 2011-08-08 DIAGNOSIS — I10 Essential (primary) hypertension: Secondary | ICD-10-CM | POA: Insufficient documentation

## 2011-08-08 DIAGNOSIS — Z113 Encounter for screening for infections with a predominantly sexual mode of transmission: Secondary | ICD-10-CM

## 2011-08-08 DIAGNOSIS — Z13820 Encounter for screening for osteoporosis: Secondary | ICD-10-CM

## 2011-08-08 DIAGNOSIS — E7800 Pure hypercholesterolemia, unspecified: Secondary | ICD-10-CM | POA: Insufficient documentation

## 2011-08-08 MED ORDER — FLUCONAZOLE 150 MG PO TABS: 150.0000 mg | ORAL_TABLET | Freq: Once | ORAL | Status: AC

## 2011-08-08 NOTE — Progress Notes (Signed)
Melissa Duran 1956-03-29 [id number]    History:    The patient presents for annual exam.  Works from home, and states has had some weight gain since.   Past medical history, past surgical history, family history and social history were all reviewed and documented in the EPIC chart.   ROS:  A  ROS was performed and pertinent positives and negatives are included in the history.  Exam:  Filed Vitals:   08/08/11 1417  BP: 130/70    General appearance:  Normal Head/Neck:  Normal, without cervical or supraclavicular adenopathy. Thyroid:  Symmetrical, normal in size, without palpable masses or nodularity. Respiratory  Effort:  Normal  Auscultation:  Clear without wheezing or rhonchi Cardiovascular  Auscultation:  Regular rate, without rubs, murmurs or gallops  Edema/varicosities:  Not grossly evident Abdominal  Soft,nontender, without masses, guarding or rebound.  Liver/spleen:  No organomegaly noted  Hernia:  None appreciated  Skin  Inspection:  Grossly normal  Palpation:  Grossly normal Neurologic/psychiatric  Orientation:  Normal with appropriate conversation.  Mood/affect:  Normal  Genitourinary    Breasts: Examined lying and sitting.     Right: Without masses, retractions, discharge or axillary adenopathy.     Left: Without masses, retractions, discharge or axillary adenopathy.   Inguinal/mons:  Normal without inguinal adenopathy  External genitalia:  Normal  BUS/Urethra/Skene's glands:  Normal  Bladder:  Normal  Vagina:  Normal/yeast  Cervix:  Absent  Uterus:  Absent              Adnexa/parametria:     Rt: Without masses or tenderness.   Lt: Without masses or tenderness.  Anus and perineum: Normal  Digital rectal exam: Normal sphincter tone without palpated masses or tenderness  Assessment/Plan:  55 y.o.DBF G1P1  for annual exam.TAH for fibroids with RSO having numerous hot flushes. Did review ERT. Did review risks of blood clots strokes and breast cancer.  States she can tolerate the hot flushes and is able to rest at night, declines need for intervention at this time. She does see Dr. Tiburcio Pea her primary care doctor for management of hypertension and increased cholesterol/ labs and medications. Negative colonoscopy in 09. Yeast was noted on wet prep  Status post TAH with RSO with menopausal symptoms no ERT. Yeast vaginitis  Plan: Diflucan 150 by mouth x1 dose prescription proper use was given reviewed. SBEs, yearly mammogram  which have been normal. Encouraged a calcium rich diet vitamin D 2000 daily. Will schedule a DEXA here at this office. Pap, HIV, hepatitis B C., and and RPR. Encouraged condoms when she becomes sexually active.     Harrington Challenger [facility], 2:51 PM 08/08/2011

## 2011-09-04 LAB — DIFFERENTIAL
Basophils Absolute: 0.1
Basophils Relative: 1
Eosinophils Absolute: 0.4
Eosinophils Relative: 5
Lymphocytes Relative: 31
Lymphs Abs: 2.6
Monocytes Absolute: 0.6
Monocytes Relative: 7
Neutro Abs: 4.7
Neutrophils Relative %: 56

## 2011-09-04 LAB — D-DIMER, QUANTITATIVE: D-Dimer, Quant: 0.37

## 2011-09-04 LAB — POCT CARDIAC MARKERS
CKMB, poc: <1 — ABNORMAL LOW
CKMB, poc: <1 — ABNORMAL LOW
Myoglobin, poc: 24.2
Myoglobin, poc: 44.2
Operator id: 196461
Operator id: 285491
Troponin i, poc: <0.05
Troponin i, poc: <0.05

## 2011-09-04 LAB — POCT I-STAT, CHEM 8
BUN: 23
Calcium, Ion: 1.16
Chloride: 106
Creatinine, Ser: 1.1
Glucose, Bld: 92
HCT: 39
Hemoglobin: 13.3
Potassium: 4
Sodium: 139
TCO2: 25

## 2011-09-04 LAB — CBC
HCT: 37.1
Hemoglobin: 12.1
MCHC: 32.6
MCV: 81.6
Platelets: 310
RBC: 4.55
RDW: 14.9
WBC: 8.3

## 2012-01-17 ENCOUNTER — Encounter: Payer: Self-pay | Admitting: Women's Health

## 2012-02-03 ENCOUNTER — Telehealth: Payer: Self-pay | Admitting: *Deleted

## 2012-02-03 NOTE — Telephone Encounter (Signed)
Pt had question about yeast and about her past visits with yeast infections. All questions answered. Pt has annual on 02/07/12 and will f/u with nancy with any other questions.

## 2012-02-07 ENCOUNTER — Encounter: Payer: Self-pay | Admitting: Women's Health

## 2012-02-07 ENCOUNTER — Ambulatory Visit (INDEPENDENT_AMBULATORY_CARE_PROVIDER_SITE_OTHER): Payer: Managed Care, Other (non HMO) | Admitting: Women's Health

## 2012-02-07 VITALS — BP 122/80 | Ht 60.0 in | Wt 158.0 lb

## 2012-02-07 DIAGNOSIS — R300 Dysuria: Secondary | ICD-10-CM

## 2012-02-07 DIAGNOSIS — N949 Unspecified condition associated with female genital organs and menstrual cycle: Secondary | ICD-10-CM

## 2012-02-07 DIAGNOSIS — Z01419 Encounter for gynecological examination (general) (routine) without abnormal findings: Secondary | ICD-10-CM

## 2012-02-07 LAB — URINALYSIS W MICROSCOPIC + REFLEX CULTURE
Bilirubin Urine: NEGATIVE
Casts: NONE SEEN
Crystals: NONE SEEN
Glucose, UA: NEGATIVE mg/dL
Ketones, ur: NEGATIVE mg/dL
Leukocytes, UA: NEGATIVE
Nitrite: NEGATIVE
Protein, ur: NEGATIVE mg/dL
Specific Gravity, Urine: 1.02 (ref 1.005–1.030)
Urobilinogen, UA: 0.2 mg/dL (ref 0.0–1.0)
pH: 6 (ref 5.0–8.0)

## 2012-02-07 LAB — WET PREP FOR TRICH, YEAST, CLUE
Clue Cells Wet Prep HPF POC: NONE SEEN
Trich, Wet Prep: NONE SEEN
Yeast Wet Prep HPF POC: NONE SEEN

## 2012-02-07 MED ORDER — CIPROFLOXACIN HCL 500 MG PO TABS: 500.0000 mg | ORAL_TABLET | Freq: Two times a day (BID) | ORAL | Status: AC

## 2012-02-07 MED ORDER — FLUCONAZOLE 100 MG PO TABS: 100.0000 mg | ORAL_TABLET | Freq: Every day | ORAL | Status: AC

## 2012-02-07 NOTE — Progress Notes (Signed)
Melissa Duran 1956-07-21 [id number]    History:    The patient presents for annual exam.  Postmenopausal with complaints of urinary urgency, frequency and recurrent yeast. History of TAH with RSO for fibroids. History of normal Paps and mammograms. History of fibrocystic breasts. Currently being treated for hypertension and increased cholesterol primary care.  Past medical history, past surgical history, family history and social history were all reviewed and documented in the EPIC chart. Normal colonoscopy in 2009.   ROS:  A  ROS was performed and pertinent positives and negatives are included in the history.  Exam:  Filed Vitals:   02/07/12 1154  BP: 122/80    General appearance:  Normal Head/Neck:  Normal, without cervical or supraclavicular adenopathy. Thyroid:  Symmetrical, normal in size, without palpable masses or nodularity. Respiratory  Effort:  Normal  Auscultation:  Clear without wheezing or rhonchi Cardiovascular  Auscultation:  Regular rate, without rubs, murmurs or gallops  Edema/varicosities:  Not grossly evident Abdominal  Soft,nontender, without masses, guarding or rebound.  Liver/spleen:  No organomegaly noted  Hernia:  None appreciated  Skin  Inspection:  Grossly normal  Palpation:  Grossly normal Neurologic/psychiatric  Orientation:  Normal with appropriate conversation.  Mood/affect:  Normal  Genitourinary    Breasts: Examined lying and sitting.     Right: Without masses, retractions, discharge or axillary adenopathy.     Left: Without masses, retractions, discharge or axillary adenopathy.   Inguinal/mons:  Normal without inguinal adenopathy  External genitalia:  Normal  BUS/Urethra/Skene's glands:  Normal  Bladder:  Normal  Vagina:  Normal/wet prep negative  Cervix:  Absent  Uterus:    Adnexa/parametria:     Rt: Without masses or tenderness.   Lt: Without masses or tenderness.  Anus and perineum: Normal  Digital rectal exam: Normal sphincter  tone without palpated masses or tenderness  Assessment/Plan:  56 y.o. SBF G1 P1  for annual exam with complaint of urinary frequency and urgency and recurrent yeast.  Questionable UTI Hypertension/hypercholesteremia-primary care labs and meds Vaginal yeast symptoms with intercourse   Plan: UA 3-6 WBCs, will treat with Cipro 500 twice a day for 3 days #6. Urine culture pending, istructed to call for culture results. UTI prevention discussed. SBE's, annual mammogram, reviewed importance of SBE's and reporting changes due to history of fibrocystic breasts. Reviewed importance of increasing exercise, decreasing calories for weight loss, calcium rich diet, vitamin D 2000 daily encourage. Declines DEXA. Diflucan 100 by mouth when necessary with intercourse. States has vaginal itching after intercourse, has intercourse approximately 2 times per month. Instructed to return to office if no relief of symptoms. Will continue with vaginal lubricants. Reviewed wet prep negative today, exam normal. Without symptoms today. Continue condoms.    Harrington Challenger WHNP, 1:26 PM 02/07/2012

## 2012-02-07 NOTE — Patient Instructions (Signed)
Monilial Vaginitis Vaginitis in a soreness, swelling and redness (inflammation) of the vagina and vulva. Monilial vaginitis is not a sexually transmitted infection. CAUSES  Yeast vaginitis is caused by yeast (candida) that is normally found in your vagina. With a yeast infection, the candida has overgrown in number to a point that upsets the chemical balance. SYMPTOMS   White, thick vaginal discharge.   Swelling, itching, redness and irritation of the vagina and possibly the lips of the vagina (vulva).   Burning or painful urination.   Painful intercourse.  DIAGNOSIS  Things that may contribute to monilial vaginitis are:  Postmenopausal and virginal states.   Pregnancy.   Infections.   Being tired, sick or stressed, especially if you had monilial vaginitis in the past.   Diabetes. Good control will help lower the chance.   Birth control pills.   Tight fitting garments.   Using bubble bath, feminine sprays, douches or deodorant tampons.   Taking certain medications that kill germs (antibiotics).   Sporadic recurrence can occur if you become ill.  TREATMENT  Your caregiver will give you medication.  There are several kinds of anti monilial vaginal creams and suppositories specific for monilial vaginitis. For recurrent yeast infections, use a suppository or cream in the vagina 2 times a week, or as directed.   Anti-monilial or steroid cream for the itching or irritation of the vulva may also be used. Get your caregiver's permission.   Painting the vagina with methylene blue solution may help if the monilial cream does not work.   Eating yogurt may help prevent monilial vaginitis.  HOME CARE INSTRUCTIONS   Finish all medication as prescribed.   Do not have sex until treatment is completed or after your caregiver tells you it is okay.   Take warm sitz baths.   Do not douche.   Do not use tampons, especially scented ones.   Wear cotton underwear.   Avoid tight  pants and panty hose.   Tell your sexual partner that you have a yeast infection. They should go to their caregiver if they have symptoms such as mild rash or itching.   Your sexual partner should be treated as well if your infection is difficult to eliminate.   Practice safer sex. Use condoms.   Some vaginal medications cause latex condoms to fail. Vaginal medications that harm condoms are:   Cleocin cream.   Butoconazole (Femstat).   Terconazole (Terazol) vaginal suppository.   Miconazole (Monistat) (may be purchased over the counter).  SEEK MEDICAL CARE IF:   You have a temperature by mouth above 102 F (38.9 C).   The infection is getting worse after 2 days of treatment.   The infection is not getting better after 3 days of treatment.   You develop blisters in or around your vagina.   You develop vaginal bleeding, and it is not your menstrual period.   You have pain when you urinate.   You develop intestinal problems.   You have pain with sexual intercourse.  Document Released: 09/04/2005 Document Revised: 08/07/2011 Document Reviewed: 05/19/2009 [employer] Patient Information 2012 [employer]. 

## 2012-02-10 LAB — URINE CULTURE: Colony Count: 45000

## 2012-06-17 ENCOUNTER — Other Ambulatory Visit: Payer: Self-pay | Admitting: Family Medicine

## 2012-06-17 DIAGNOSIS — H3400 Transient retinal artery occlusion, unspecified eye: Secondary | ICD-10-CM

## 2012-06-19 ENCOUNTER — Other Ambulatory Visit: Payer: Managed Care, Other (non HMO)

## 2012-06-22 ENCOUNTER — Ambulatory Visit
Admission: RE | Admit: 2012-06-22 | Discharge: 2012-06-22 | Disposition: A | Discharge status: Home / Self Care | Payer: Managed Care, Other (non HMO) | Source: Ambulatory Visit | Attending: Family Medicine | Admitting: Family Medicine

## 2012-06-22 DIAGNOSIS — H3400 Transient retinal artery occlusion, unspecified eye: Secondary | ICD-10-CM

## 2012-06-24 ENCOUNTER — Other Ambulatory Visit: Payer: Self-pay | Admitting: Family Medicine

## 2012-06-24 DIAGNOSIS — E041 Nontoxic single thyroid nodule: Secondary | ICD-10-CM

## 2012-07-03 ENCOUNTER — Ambulatory Visit
Admission: RE | Admit: 2012-07-03 | Discharge: 2012-07-03 | Disposition: A | Discharge status: Home / Self Care | Payer: Managed Care, Other (non HMO) | Source: Ambulatory Visit | Attending: Family Medicine | Admitting: Family Medicine

## 2012-07-03 DIAGNOSIS — E041 Nontoxic single thyroid nodule: Secondary | ICD-10-CM

## 2012-07-14 ENCOUNTER — Other Ambulatory Visit: Payer: Self-pay | Admitting: Family Medicine

## 2012-07-14 DIAGNOSIS — E041 Nontoxic single thyroid nodule: Secondary | ICD-10-CM

## 2012-07-16 ENCOUNTER — Ambulatory Visit
Admission: RE | Admit: 2012-07-16 | Discharge: 2012-07-16 | Disposition: A | Discharge status: Home / Self Care | Payer: Managed Care, Other (non HMO) | Source: Ambulatory Visit | Attending: Family Medicine | Admitting: Family Medicine

## 2012-07-16 ENCOUNTER — Other Ambulatory Visit (HOSPITAL_COMMUNITY)
Admission: RE | Admit: 2012-07-16 | Discharge: 2012-07-16 | Disposition: A | Discharge status: Home / Self Care | Payer: Managed Care, Other (non HMO) | Source: Ambulatory Visit | Attending: Interventional Radiology | Admitting: Interventional Radiology

## 2012-07-16 DIAGNOSIS — E041 Nontoxic single thyroid nodule: Secondary | ICD-10-CM

## 2012-07-16 DIAGNOSIS — E049 Nontoxic goiter, unspecified: Secondary | ICD-10-CM | POA: Insufficient documentation

## 2012-12-08 ENCOUNTER — Other Ambulatory Visit (HOSPITAL_COMMUNITY): Payer: Managed Care, Other (non HMO)

## 2012-12-08 ENCOUNTER — Ambulatory Visit (HOSPITAL_COMMUNITY)
Admission: RE | Admit: 2012-12-08 | Discharge: 2012-12-08 | Disposition: A | Discharge status: Home / Self Care | Payer: Managed Care, Other (non HMO) | Source: Ambulatory Visit | Attending: Family Medicine | Admitting: Family Medicine

## 2012-12-08 ENCOUNTER — Other Ambulatory Visit (HOSPITAL_COMMUNITY): Payer: Self-pay | Admitting: Family Medicine

## 2012-12-08 DIAGNOSIS — R209 Unspecified disturbances of skin sensation: Secondary | ICD-10-CM | POA: Insufficient documentation

## 2012-12-08 DIAGNOSIS — R202 Paresthesia of skin: Secondary | ICD-10-CM

## 2012-12-08 DIAGNOSIS — R51 Headache: Secondary | ICD-10-CM | POA: Insufficient documentation

## 2012-12-08 DIAGNOSIS — I10 Essential (primary) hypertension: Secondary | ICD-10-CM | POA: Insufficient documentation

## 2012-12-08 MED ORDER — GADOBENATE DIMEGLUMINE 529 MG/ML IV SOLN
15.0000 mL | Freq: Once | INTRAVENOUS | Status: AC | PRN
  Administered 2012-12-08: 15 mL via INTRAVENOUS

## 2012-12-10 LAB — POCT I-STAT, CHEM 8
BUN: 26 mg/dL — ABNORMAL HIGH (ref 6–23)
Calcium, Ion: 1.24 mmol/L — ABNORMAL HIGH (ref 1.12–1.23)
Chloride: 107 mEq/L (ref 96–112)
Creatinine, Ser: 1.2 mg/dL — ABNORMAL HIGH (ref 0.50–1.10)
Glucose, Bld: 96 mg/dL (ref 70–99)
HCT: 37 % (ref 36.0–46.0)
Hemoglobin: 12.6 g/dL (ref 12.0–15.0)
Potassium: 3.7 mEq/L (ref 3.5–5.1)
Sodium: 142 mEq/L (ref 135–145)
TCO2: 27 mmol/L (ref 0–100)

## 2012-12-24 ENCOUNTER — Encounter: Payer: Self-pay | Admitting: Women's Health

## 2012-12-24 ENCOUNTER — Ambulatory Visit (INDEPENDENT_AMBULATORY_CARE_PROVIDER_SITE_OTHER): Payer: Managed Care, Other (non HMO) | Admitting: Women's Health

## 2012-12-24 DIAGNOSIS — N898 Other specified noninflammatory disorders of vagina: Secondary | ICD-10-CM

## 2012-12-24 LAB — WET PREP FOR TRICH, YEAST, CLUE
Clue Cells Wet Prep HPF POC: NONE SEEN
Trich, Wet Prep: NONE SEEN
Yeast Wet Prep HPF POC: NONE SEEN

## 2012-12-24 NOTE — Progress Notes (Signed)
Patient ID: Melissa Duran, female   DOB: 1956-05-18, 57 y.o.   MRN: [id number] Presents with bright red spotting that occurred after intercourse. Denies dyspareunia, states mild dryness. TAH with RSO for fibroids. No ERT. Denies urinary symptoms, vaginal itching or odor. Same partner.  Exam: Is not feeling well, has had problems with headaches does have followup with neurologist. External genitalia erythematous at introitus with no active bleeding noted. Speculum exam slight vaginal atrophy with minimal discharge with no bleeding noted. Wet prep negative.  Vaginal atrophy  Plan: Reviewed importance of vaginal lubricants with intercourse, instructed to call or return if continued problems. Sample of hyalo gyn gel given with instructions.

## 2012-12-25 ENCOUNTER — Encounter: Payer: Self-pay | Admitting: Gynecology

## 2013-02-05 ENCOUNTER — Encounter: Payer: Self-pay | Admitting: Neurology

## 2013-02-10 ENCOUNTER — Encounter: Payer: Managed Care, Other (non HMO) | Admitting: Women's Health

## 2013-03-08 ENCOUNTER — Ambulatory Visit: Payer: Self-pay | Admitting: Neurology

## 2013-03-08 ENCOUNTER — Ambulatory Visit (INDEPENDENT_AMBULATORY_CARE_PROVIDER_SITE_OTHER): Payer: Managed Care, Other (non HMO) | Admitting: Neurology

## 2013-03-08 ENCOUNTER — Encounter: Payer: Self-pay | Admitting: Neurology

## 2013-03-08 VITALS — BP 140/96 | HR 86 | Temp 98.0°F | Ht 61.0 in | Wt 161.0 lb

## 2013-03-08 DIAGNOSIS — Q211 Atrial septal defect: Secondary | ICD-10-CM

## 2013-03-08 DIAGNOSIS — R0683 Snoring: Secondary | ICD-10-CM

## 2013-03-08 DIAGNOSIS — R930 Abnormal findings on diagnostic imaging of skull and head, not elsewhere classified: Secondary | ICD-10-CM | POA: Insufficient documentation

## 2013-03-08 DIAGNOSIS — H539 Unspecified visual disturbance: Secondary | ICD-10-CM

## 2013-03-08 DIAGNOSIS — R32 Unspecified urinary incontinence: Secondary | ICD-10-CM

## 2013-03-08 DIAGNOSIS — F980 Enuresis not due to a substance or known physiological condition: Secondary | ICD-10-CM

## 2013-03-08 DIAGNOSIS — R0989 Other specified symptoms and signs involving the circulatory and respiratory systems: Secondary | ICD-10-CM

## 2013-03-08 DIAGNOSIS — R292 Abnormal reflex: Secondary | ICD-10-CM

## 2013-03-08 DIAGNOSIS — R0609 Other forms of dyspnea: Secondary | ICD-10-CM

## 2013-03-08 MED ORDER — LORAZEPAM 1 MG PO TABS: 1.0000 mg | ORAL_TABLET | Freq: Three times a day (TID) | ORAL | Status: DC

## 2013-03-08 NOTE — Patient Instructions (Signed)
Plan to check transcranial Doppler bubble study for adequacy of PFO closure and emboli monitoring for her visual disturbances. Check polysomnogram for sleep apnea and sleep disturbance. Check MRI scan of the neck for spinal stenosis given her neck pain and episodes of bladder incontinence.She is financially constrained at the present time and plans to do the above tests one at a time as she can afford it . Return for followup as necessary.

## 2013-03-08 NOTE — Progress Notes (Signed)
HPI: Melissa Duran is a 57 year old African American lady referred by Dr. Aram Beecham white for evaluation for abnormal brain MRI scan. Melissa Duran had an MRI scan of the brain with and without contrast on 12/08/2012 for symptoms of headache ,visual disturbance and right-sided facial pain. MRI scan shows nonspecific periventricular and subcortical scattered white matter hyperintensities which are nonenhancing. These do not involve the corpus callosum or brainstem or cerebellum. As compared with previous MRI scan from 03/07/2007 there appear to be minimum progression.She was found to have a patent foramen ovale and underwent endovascular closure by Dr. Jacinto Halim in 2009 The patient does states she's no longer having headaches occur any of focal vertigo symptoms. She has had a long-standing history of transient visual disturbances for last several years. She in fact was seen by me 3 years ago for about symptoms and had thought they may be migraine equivalents. She describes these been quite transient lasting less than a minute hand like a shade coming in front of her eyes. She denies any accompanying headache or headache following this. There is no obvious triggers. They occur quite infrequently once every 2 months or so. The past we have not felt that these were frequent or severe enough to merit treatment. She had an extensive evaluation in the past for demyelinating disease and neurovascular etiology which was on breathing. On inquiry today she states that she's had at least 3 episodes of urinary incontinence while in sleep. On 2 occasions she had slept on a hard bed at her sister's house and had woken up with some neck pain and discomfort and on 2 consecutive nights she had wet herself. She denies significant bladder urgency or urge incontinence. She does admit to some posterior neck pain and muscle tightness but denies any radicular pain or Lhermitte`s sign.She denies significant loss of balance, gait instability stiffness in  the legs or fall..  ROS: 14 system review of systems is positive for visual disturbances, urinary incontinence, urination problems, anemia, headache, snoring, decreased energy. Physical Exam General: well developed, well nourished, seated, in no evident distress Head: head normocephalic and atraumatic. Orohparynx benign Neck: supple with no carotid or supraclavicular bruits Cardiovascular: regular rate and rhythm, no murmurs Musculoskeletal mild obesity. Skin no rash  Neurologic Exam Mental Status: Awake and fully alert. Oriented to place and time. Recent and remote memory intact. Attention span, concentration and fund of knowledge appropriate. Mood and affect appropriate.  Cranial Nerves: Fundoscopic exam reveals sharp disc margins. Pupils equal, briskly reactive to light. Extraocular movements full without nystagmus. Visual fields full to confrontation. Hearing intact and symmetric to finer snap. Facial sensation intact. Face, tongue, palate move normally and symmetrically. Neck flexion and extension normal.  Motor: Normal bulk and tone. Normal strength in all tested extremity muscles. Sensory.: intact to tough and pinprick and vibratory.  Coordination: Rapid alternating movements normal in all extremities. Finger-to-nose and heel-to-shin performed accurately bilaterally. Gait and Station: Arises from chair without difficulty. Stance is normal. Gait demonstrates normal stride length and balance . Able to heel, toe and tandem walk without difficulty.  Reflexes: 3+ and symmetric. Toes Right is equivocal and left is downgoing.     ASSESSMENT::  56 her lady with abnormal brain MRI scan showing multiple nonspecific subcortical and periventricular white matter hyperintensities of unclear etiology. Workup in the past for neurovascular and demyelinating etiologies has been negative. Recent MRI shows slight worsening of compared to previous MRI from 2011.History of infrequent transient episodes of  visual disturbances possible migraine equivalents.  Remote history of patent foramen oval status post endovascular closure in 2009 New complains of nocturnal urinary incontinence as well as neck pain and hyperreflexia on exam raising concern for cervical myelopathy.  Suggestive History and risk factors for sleep apnea   PLAN: Check MRI scan of the cervical spine for compressive lesions and polysomnogram for sleep apnea. Check transcranial Doppler bubble study for adequacy of PFO closure and emboli monitoring for her visual disturbances.Continue Aggrenox for stroke prevention and strict control of hypertension with blood pressure goal below 130/90 and lipids with LDL cholesterol goal below 100 mg percent.

## 2013-04-09 ENCOUNTER — Ambulatory Visit (INDEPENDENT_AMBULATORY_CARE_PROVIDER_SITE_OTHER): Payer: Managed Care, Other (non HMO)

## 2013-04-09 ENCOUNTER — Other Ambulatory Visit: Payer: Managed Care, Other (non HMO)

## 2013-04-09 ENCOUNTER — Ambulatory Visit (INDEPENDENT_AMBULATORY_CARE_PROVIDER_SITE_OTHER): Payer: Managed Care, Other (non HMO) | Admitting: Neurology

## 2013-04-09 ENCOUNTER — Other Ambulatory Visit: Payer: Self-pay | Admitting: Neurology

## 2013-04-09 DIAGNOSIS — Z0289 Encounter for other administrative examinations: Secondary | ICD-10-CM

## 2013-04-09 DIAGNOSIS — G939 Disorder of brain, unspecified: Secondary | ICD-10-CM

## 2013-04-09 DIAGNOSIS — Q211 Atrial septal defect: Secondary | ICD-10-CM

## 2013-04-09 DIAGNOSIS — H5310 Unspecified subjective visual disturbances: Secondary | ICD-10-CM

## 2013-04-09 DIAGNOSIS — H539 Unspecified visual disturbance: Secondary | ICD-10-CM

## 2013-04-09 DIAGNOSIS — R51 Headache: Secondary | ICD-10-CM

## 2013-04-09 NOTE — Progress Notes (Signed)
HPI: (update): Melissa Duran is a 57-yo AA female  Here today for transcranial doppler with Bubble study.  She states her visual disturbance is still intermittent and has not changed.  Headaches continue, occurring approximately once a week, with pain averaging 7 on a 10-point scale.  Pt. States she uses [place] or [employer] for headache pain relief and she usually must lay down in a dark quiet room if pain is over a 7.  Denies focal vertigo symptoms.  The visual disturbances continue intermittently in the right eye which she describes as a shade that comes over the eye.   Previous Note: Melissa Duran is a 57 year old African American lady referred by Dr. Aram Beecham white for evaluation for abnormal brain MRI scan.  Ms. Melissa Duran had an MRI scan of the brain with and without contrast on 12/08/2012 for symptoms of headache ,visual disturbance and right-sided facial pain. MRI scan shows nonspecific periventricular and subcortical scattered white matter hyperintensities which are nonenhancing. These do not involve the corpus callosum or brainstem or cerebellum. As compared with previous MRI scan from 03/07/2007 there appear to be minimum progression.She was found to have a patent foramen ovale and underwent endovascular closure by Dr. Jacinto Halim in 2009 The patient does states she's no longer having headaches occur any of focal vertigo symptoms. She has had a long-standing history of transient visual disturbances for last several years. She in fact was seen by me 3 years ago for about symptoms and had thought they may be migraine equivalents. She describes these been quite transient lasting less than a minute hand like a shade coming in front of her eyes. She denies any accompanying headache or headache following this. There is no obvious triggers. They occur quite infrequently once every 2 months or so. The past we have not felt that these were frequent or severe enough to merit treatment. She had an extensive evaluation in the  past for demyelinating disease and neurovascular etiology which was on breathing. On inquiry today she states that she's had at least 3 episodes of urinary incontinence while in sleep. On 2 occasions she had slept on a hard bed at her sister's house and had woken up with some neck pain and discomfort and on 2 consecutive nights she had wet herself. She denies significant bladder urgency or urge incontinence. She does admit to some posterior neck pain and muscle tightness but denies any radicular pain or Lhermitte`s sign.She denies significant loss of balance, gait instability stiffness in the legs or fall..  ROS:  14 system review of systems is positive for headache, visual distrurbances.  Physical Exam  General: well developed, well nourished, seated, in no evident distress  Head: head normocephalic and atraumatic. Orohparynx benign  Neck: supple with no carotid or supraclavicular bruits  Cardiovascular: normal Musculoskeletal mild obesity.  Skin no rash  Neurologic Exam  Mental Status: Awake and fully alert. Oriented to place and time. Recent and remote memory intact. Attention span, concentration and fund of knowledge appropriate. Mood and affect appropriate.  Cranial Nerves: Fundi not visualized. Pupils equal, briskly reactive to light. Extraocular movements full without nystagmus. Visual fields full to confrontation. Hearing intact and symmetric to finer snap. Facial sensation intact. Face, tongue, palate move normally and symmetrically. Neck flexion and extension normal.  Motor: Normal bulk and tone. Normal strength in all tested extremity muscles.  Sensory.: intact to tough and pinprick and vibratory.  Coordination: Rapid alternating movements normal in all extremities. Finger-to-nose accurately bilaterallyis .  Gait and Station:  not assessed. Reflexes:   ASSESSMENT::   PLAN:  Continue Aggrenox for stroke prevention and strict control of hypertension with blood pressure goal below  130/90 and lipids with LDL cholesterol goal below 100 mg percent. Patient plans on having outpatient MRI scan and sleep study done soon return for followup with Lynn,nurse practitioner in 3 months        [facility]      [address]      [place] [address] [phone]       TRANSCRANIAL DOPPLER BUBBLE STUDY   Ms. LABRIA Duran Date of Birth:  April 07, 1956 Medical Record Number:  [id number]   Indications: Diagnostic Date of Procedure:04/09/13 Clinical History:  Headache and visual disturbances s/p PFO closure in 2009 Technical Description:   Transcranial Doppler Bubble Study was performed at the bedside after taking written informed consent from the patient and explaining risk/benefits. Only the left  middle cerebral artery was insonated using a headset. And IV line was inserted in the left forearm by the RN using aseptic precautions. Agitated saline injection at rest and after valsalva maneuver  did not result in high intensity transient signals (HITS).   Impression:   Negative Transcranial Doppler Bubble Study indicative of successful complete endovascular closure of PFO without any residual  right to left intracardiac shunt.   Results were explained to the patient. Questions were answered.

## 2013-04-09 NOTE — Patient Instructions (Addendum)
Return in 3 months for follow up with Larita Fife, NP. Keep appointment for outpatient MRI scan and sleep study. Continue Aggrenox for stroke prevention and strict control of hypertension and hyperlipidemia the

## 2013-04-23 ENCOUNTER — Other Ambulatory Visit: Payer: Self-pay | Admitting: Neurology

## 2013-04-23 ENCOUNTER — Other Ambulatory Visit: Payer: Self-pay | Admitting: Radiology

## 2013-04-23 DIAGNOSIS — G939 Disorder of brain, unspecified: Secondary | ICD-10-CM

## 2013-05-14 ENCOUNTER — Encounter: Payer: Self-pay | Admitting: Gynecology

## 2014-01-01 IMAGING — US US CAROTID DUPLEX BILAT
1 series · 13 of 24 positions shown · non-contrast
Comparison: MRI brain 03/07/2007

CLINICAL DATA: Possible amaurosis fugax right eye

BILATERAL CAROTID DUPLEX ULTRASOUND
TECHNIQUE: Gray scale imaging, color Doppler and duplex ultrasound
was performed of bilateral carotid and vertebral arteries in the
neck.

[Series 1: us carotid duplex bilat · 0.07mm/px · 13 of 66 slices shown]
[im 1/66]
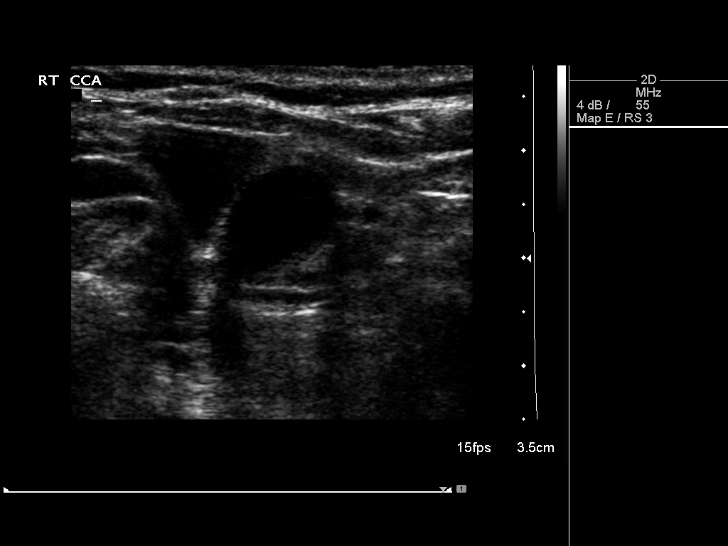
[im 6/66]
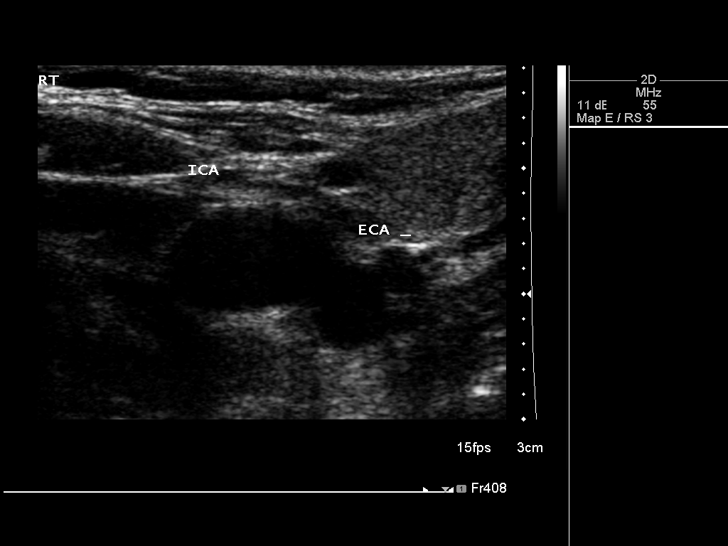
[im 12/66]
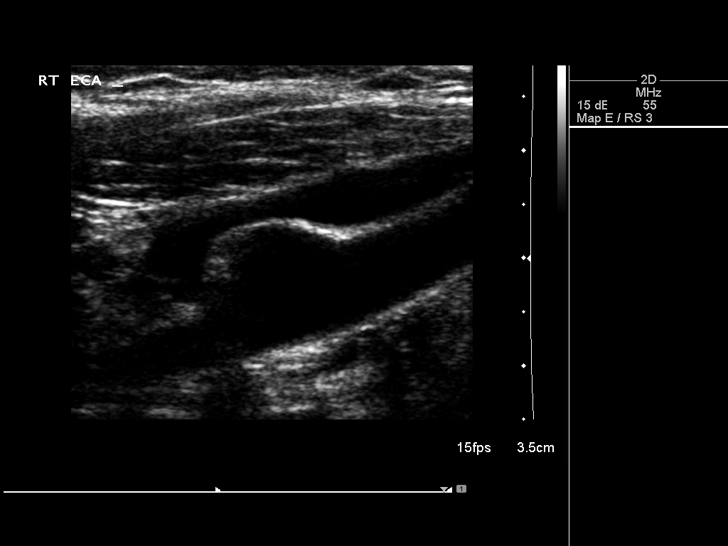
[im 17/66]
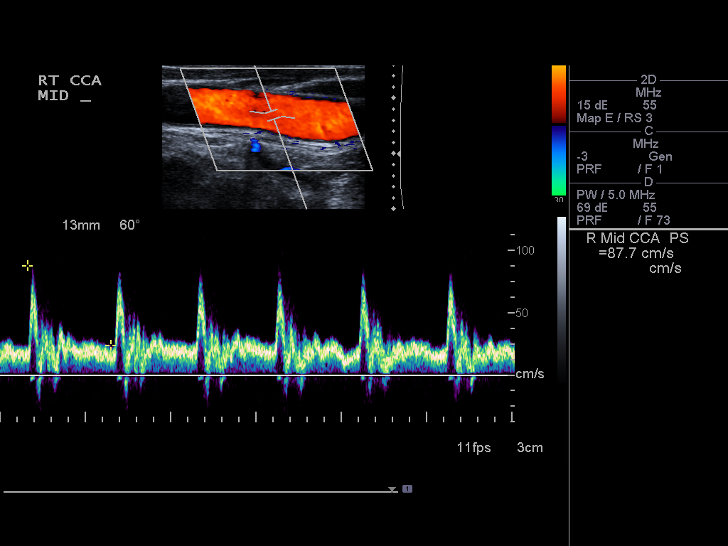
[im 23/66]
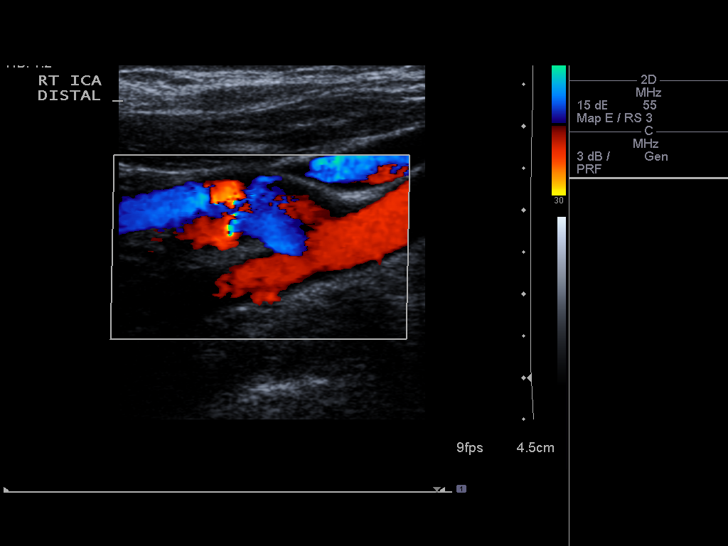
[im 29/66]
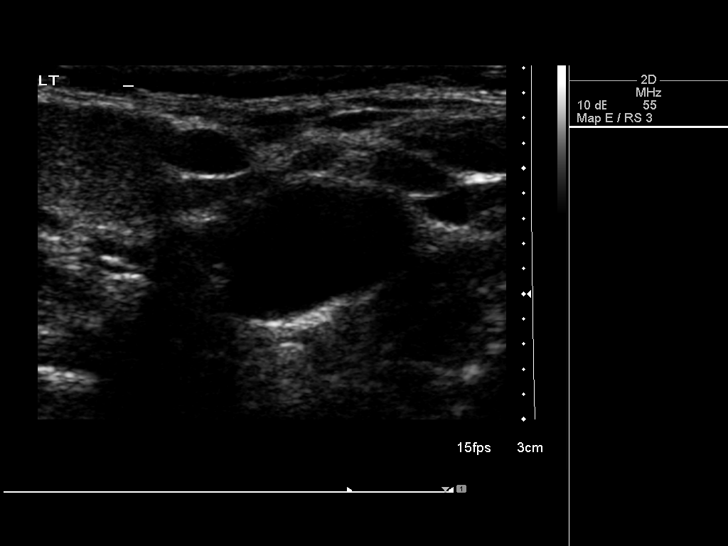
[im 34/66]
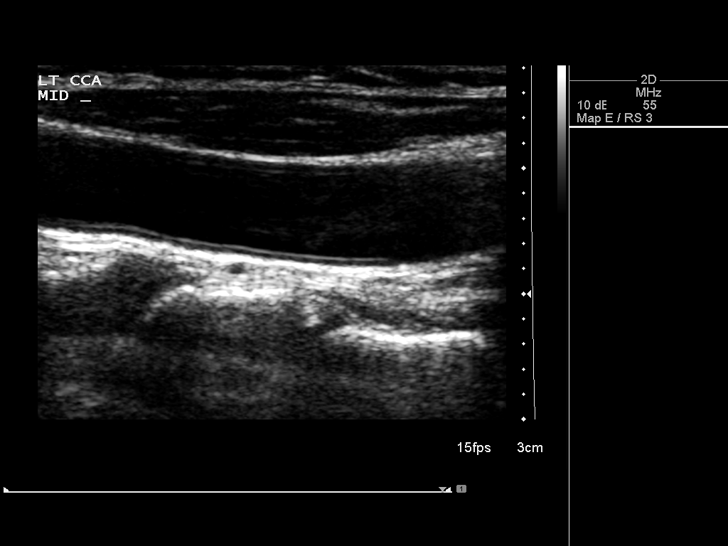
[im 37/66]
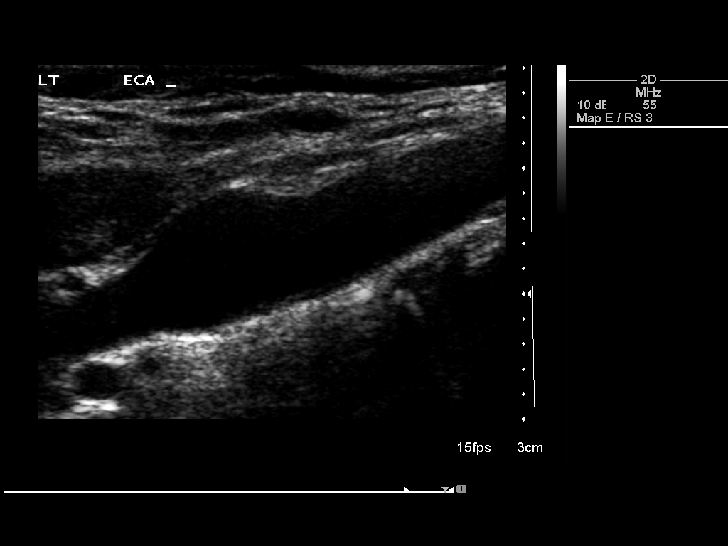
[im 43/66]
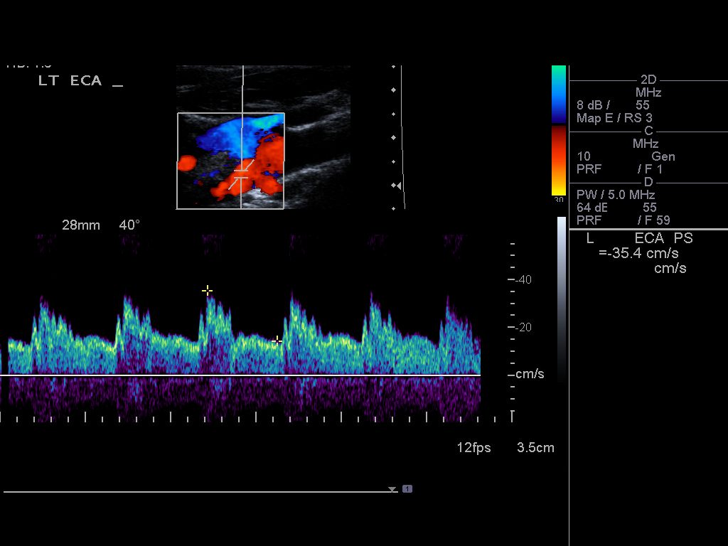
[im 49/66]
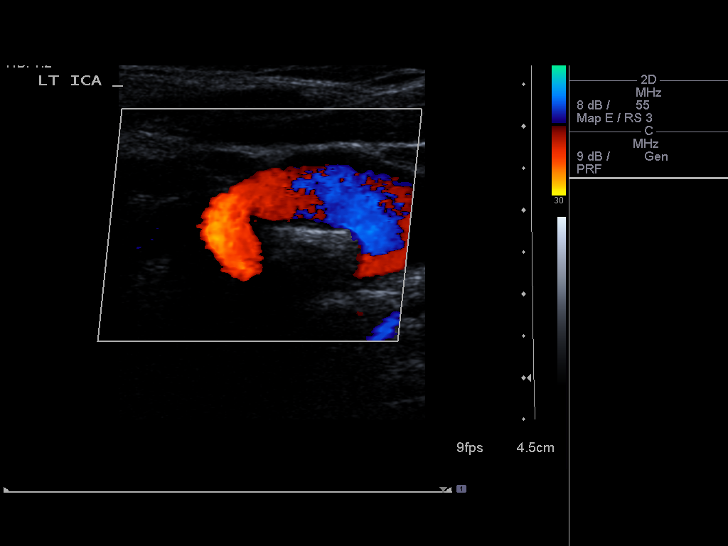
[im 54/66]
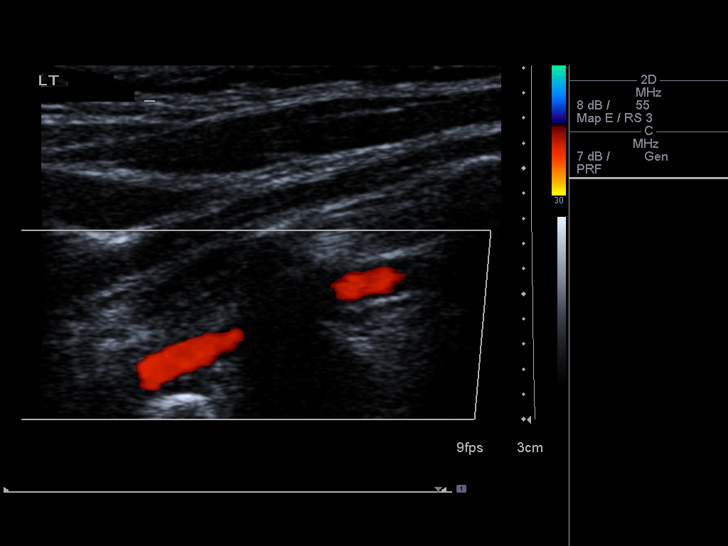
[im 60/66]
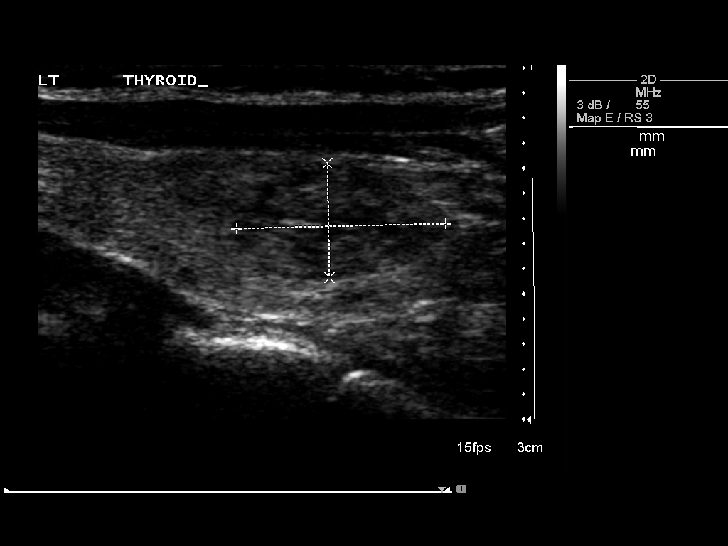
[im 66/66]
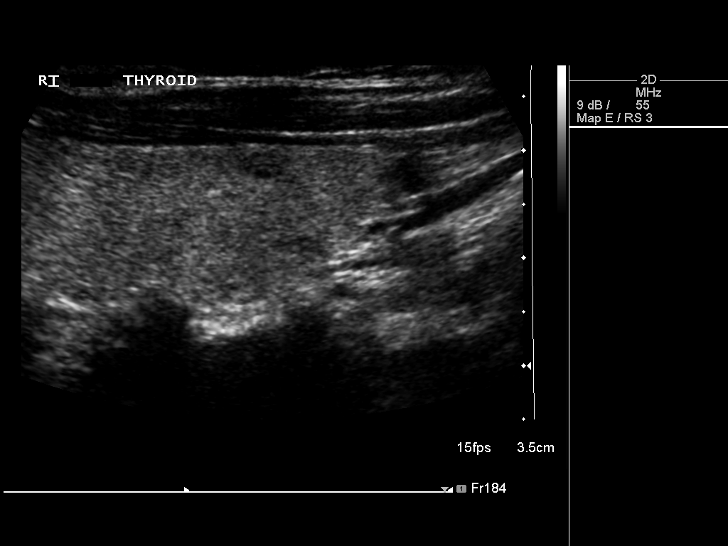

[13 of 24 positions shown; findings below may reference images not displayed]

Criteria:  Quantification of carotid stenosis is based on velocity
parameters that correlate the residual internal carotid diameter
with NASCET-based stenosis levels, using the diameter of the distal
internal carotid lumen as the denominator for stenosis measurement.

The following velocity measurements were obtained:

                 PEAK SYSTOLIC/END DIASTOLIC
RIGHT
ICA:                        81/31cm/sec
CCA:                        88/24cm/sec
SYSTOLIC ICA/CCA RATIO:
DIASTOLIC ICA/CCA RATIO:
ECA:                        54cm/sec

LEFT
ICA:                        116/48cm/sec
CCA:                        93/24cm/sec
SYSTOLIC ICA/CCA RATIO:
DIASTOLIC ICA/CCA RATIO:
ECA:                        35cm/sec
FINDINGS: RIGHT CAROTID ARTERY: No significant calcified atherosclerotic
plaque.  Systolic velocities are within normal limits.  Color
Doppler evaluation is unremarkable.  Spectral Doppler wave forms
are within normal limits.

RIGHT VERTEBRAL ARTERY:  Patent with antegrade flow.  Spectral
Doppler wave forms are within normal limits.

LEFT CAROTID ARTERY: No significant atherosclerotic calcified
plaque. Color Doppler evaluation and spectral Doppler wave forms
are unremarkable.

LEFT VERTEBRAL ARTERY:  Patent with antegrade flow.  Spectral
Doppler analysis is unremarkable

Other: Incidentally, and incompletely imaged solid nodule in the
left thyroid lobe measures approximately 1.7 x 0.9 cm
IMPRESSION: 1.  No hemodynamically significant stenosis in either carotid
artery.
2.  Incidentally, and incompletely imaged solid nodule in the left
thyroid lobe.  Recommend further evaluation with dedicated thyroid
ultrasound as this lesion may warrant biopsy.
3.  Normal antegrade flow in the vertebral arteries bilaterally.

## 2014-02-01 ENCOUNTER — Encounter: Payer: Managed Care, Other (non HMO) | Admitting: Women's Health

## 2014-02-17 ENCOUNTER — Encounter: Payer: Self-pay | Admitting: Women's Health

## 2014-02-17 ENCOUNTER — Ambulatory Visit (INDEPENDENT_AMBULATORY_CARE_PROVIDER_SITE_OTHER): Payer: Managed Care, Other (non HMO) | Admitting: Women's Health

## 2014-02-17 VITALS — BP 118/82 | Ht 60.75 in | Wt 159.8 lb

## 2014-02-17 DIAGNOSIS — Z780 Asymptomatic menopausal state: Secondary | ICD-10-CM

## 2014-02-17 DIAGNOSIS — Z01419 Encounter for gynecological examination (general) (routine) without abnormal findings: Secondary | ICD-10-CM

## 2014-02-17 NOTE — Patient Instructions (Signed)
Health Recommendations for Postmenopausal Women Respected and ongoing research has looked at the most common causes of death, disability, and poor quality of life in postmenopausal women. The causes include heart disease, diseases of blood vessels, diabetes, depression, cancer, and bone loss (osteoporosis). Many things can be done to help lower the chances of developing these and other common problems: CARDIOVASCULAR DISEASE Heart Disease: A heart attack is a medical emergency. Know the signs and symptoms of a heart attack. Below are things women can do to reduce their risk for heart disease.   Do not smoke. If you smoke, quit.  Aim for a healthy weight. Being overweight causes many preventable deaths. Eat a healthy and balanced diet and drink an adequate amount of liquids.  Get moving. Make a commitment to be more physically active. Aim for 30 minutes of activity on most, if not all days of the week.  Eat for heart health. Choose a diet that is low in saturated fat and cholesterol and eliminate trans fat. Include whole grains, vegetables, and fruits. Read and understand the labels on food containers before buying.  Know your numbers. Ask your caregiver to check your blood pressure, cholesterol (total, HDL, LDL, triglycerides) and blood glucose. Work with your caregiver on improving your entire clinical picture.  High blood pressure. Limit or stop your table salt intake (try salt substitute and food seasonings). Avoid salty foods and drinks. Read labels on food containers before buying. Eating well and exercising can help control high blood pressure. STROKE  Stroke is a medical emergency. Stroke may be the result of a blood clot in a blood vessel in the brain or by a brain hemorrhage (bleeding). Know the signs and symptoms of a stroke. To lower the risk of developing a stroke:  Avoid fatty foods.  Quit smoking.  Control your diabetes, blood pressure, and irregular heart rate. THROMBOPHLEBITIS  (BLOOD CLOT) OF THE LEG  Becoming overweight and leading a stationary lifestyle may also contribute to developing blood clots. Controlling your diet and exercising will help lower the risk of developing blood clots. CANCER SCREENING  Breast Cancer: Take steps to reduce your risk of breast cancer.  You should practice "breast self-awareness." This means understanding the normal appearance and feel of your breasts and should include breast self-examination. Any changes detected, no matter how small, should be reported to your caregiver.  After age 40, you should have a clinical breast exam (CBE) every year.  Starting at age 40, you should consider having a mammogram (breast X-ray) every year.  If you have a family history of breast cancer, talk to your caregiver about genetic screening.  If you are at high risk for breast cancer, talk to your caregiver about having an MRI and a mammogram every year.  Intestinal or Stomach Cancer: Tests to consider are a rectal exam, fecal occult blood, sigmoidoscopy, and colonoscopy. Women who are high risk may need to be screened at an earlier age and more often.  Cervical Cancer:  Beginning at age 30, you should have a Pap test every 3 years as long as the past 3 Pap tests have been normal.  If you have had past treatment for cervical cancer or a condition that could lead to cancer, you need Pap tests and screening for cancer for at least 20 years after your treatment.  If you had a hysterectomy for a problem that was not cancer or a condition that could lead to cancer, then you no longer need Pap tests.    If you are between ages 65 and 70, and you have had normal Pap tests going back 10 years, you no longer need Pap tests.  If Pap tests have been discontinued, risk factors (such as a new sexual partner) need to be reassessed to determine if screening should be resumed.  Some medical problems can increase the chance of getting cervical cancer. In these  cases, your caregiver may recommend more frequent screening and Pap tests.  Uterine Cancer: If you have vaginal bleeding after reaching menopause, you should notify your caregiver.  Ovarian cancer: Other than yearly pelvic exams, there are no reliable tests available to screen for ovarian cancer at this time except for yearly pelvic exams.  Lung Cancer: Yearly chest X-rays can detect lung cancer and should be done on high risk women, such as cigarette smokers and women with chronic lung disease (emphysema).  Skin Cancer: A complete body skin exam should be done at your yearly examination. Avoid overexposure to the sun and ultraviolet light lamps. Use a strong sun block cream when in the sun. All of these things are important in lowering the risk of skin cancer. MENOPAUSE Menopause Symptoms: Hormone therapy products are effective for treating symptoms associated with menopause:  Moderate to severe hot flashes.  Night sweats.  Mood swings.  Headaches.  Tiredness.  Loss of sex drive.  Insomnia.  Other symptoms. Hormone replacement carries certain risks, especially in older women. Women who use or are thinking about using estrogen or estrogen with progestin treatments should discuss that with their caregiver. Your caregiver will help you understand the benefits and risks. The ideal dose of hormone replacement therapy is not known. The Food and Drug Administration (FDA) has concluded that hormone therapy should be used only at the lowest doses and for the shortest amount of time to reach treatment goals.  OSTEOPOROSIS Protecting Against Bone Loss and Preventing Fracture: If you use hormone therapy for prevention of bone loss (osteoporosis), the risks for bone loss must outweigh the risk of the therapy. Ask your caregiver about other medications known to be safe and effective for preventing bone loss and fractures. To guard against bone loss or fractures, the following is recommended:  If  you are less than age 50, take 1000 mg of calcium and at least 600 mg of Vitamin D per day.  If you are greater than age 50 but less than age 70, take 1200 mg of calcium and at least 600 mg of Vitamin D per day.  If you are greater than age 70, take 1200 mg of calcium and at least 800 mg of Vitamin D per day. Smoking and excessive alcohol intake increases the risk of osteoporosis. Eat foods rich in calcium and vitamin D and do weight bearing exercises several times a week as your caregiver suggests. DIABETES Diabetes Melitus: If you have Type I or Type 2 diabetes, you should keep your blood sugar under control with diet, exercise and recommended medication. Avoid too many sweets, starchy and fatty foods. Being overweight can make control more difficult. COGNITION AND MEMORY Cognition and Memory: Menopausal hormone therapy is not recommended for the prevention of cognitive disorders such as Alzheimer's disease or memory loss.  DEPRESSION  Depression may occur at any age, but is common in elderly women. The reasons may be because of physical, medical, social (loneliness), or financial problems and needs. If you are experiencing depression because of medical problems and control of symptoms, talk to your caregiver about this. Physical activity and   exercise may help with mood and sleep. Community and volunteer involvement may help your sense of value and worth. If you have depression and you feel that the problem is getting worse or becoming severe, talk to your caregiver about treatment options that are best for you. ACCIDENTS  Accidents are common and can be serious in the elderly woman. Prepare your house to prevent accidents. Eliminate throw rugs, place hand bars in the bath, shower and toilet areas. Avoid wearing high heeled shoes or walking on wet, snowy, and icy areas. Limit or stop driving if you have vision or hearing problems, or you feel you are unsteady with you movements and  reflexes. HEPATITIS C Hepatitis C is a type of viral infection affecting the liver. It is spread mainly through contact with blood from an infected person. It can be treated, but if left untreated, it can lead to severe liver damage over years. Many people who are infected do not know that the virus is in their blood. If you are a "baby-boomer", it is recommended that you have one screening test for Hepatitis C. IMMUNIZATIONS  Several immunizations are important to consider having during your senior years, including:   Tetanus, diptheria, and pertussis booster shot.  Influenza every year before the flu season begins.  Pneumonia vaccine.  Shingles vaccine.  Others as indicated based on your specific needs. Talk to your caregiver about these. Document Released: 01/17/2006 Document Revised: 11/11/2012 Document Reviewed: 09/12/2008 [employer] Patient Information 2014 [employer].  

## 2014-02-17 NOTE — Progress Notes (Signed)
Margy ClarksJeanette C Belling 08/13/1956 [id number]    History:    Presents for annual exam.  TAH with RSO for fibroids on no HRT. Cardiac surgery 2006. Hypertension/ hypercholesterolemia primary care manages. Normal Pap and mammogram history. Has not had a bone density. Negative colonoscopy 2009.  Past medical history, past surgical history, family history and social history were all reviewed and documented in the EPIC chart. Works for [employer] for past 30 years[name] 23 [place] [facility]. Mother, sister hypertension.  ROS:  A  ROS was performed and pertinent positives and negatives are included.  Exam:  Filed Vitals:   02/17/14 0833  BP: 118/82    General appearance:  Normal Thyroid:  Symmetrical, normal in size, without palpable masses or nodularity. Respiratory  Auscultation:  Clear without wheezing or rhonchi Cardiovascular  Auscultation:  Regular rate, without rubs, murmurs or gallops  Edema/varicosities:  Not grossly evident Abdominal  Soft,nontender, without masses, guarding or rebound.  Liver/spleen:  No organomegaly noted  Hernia:  None appreciated  Skin  Inspection:  Grossly normal   Breasts: Examined lying and sitting.     Right: Without masses, retractions, discharge or axillary adenopathy.     Left: Without masses, retractions, discharge or axillary adenopathy. Gentitourinary   Inguinal/mons:  Normal without inguinal adenopathy  External genitalia:  Normal  BUS/Urethra/Skene's glands:  Normal  Vagina:  Normal  Cervix and uterus absent, no adnexal fullness or tenderness  Anus and perineum: Normal  Digital rectal exam: Normal sphincter tone without palpated masses or tenderness  Assessment/Plan:  58 y.o. DBF G1P1 for annual exam with no complaints.   TAH with RSO for fibroids on no HRT Hypertension/hypercholesterolemia/heart disease-primary care manages labs and meds  Plan: SBE's, continue annual mammogram, calcium rich diet, vitamin D 2000 daily  encouraged. DEXA, will schedule here. Reviewed importance of regular exercise, home safety, fall prevention discussed for bone health. Encourage vaginal lubricants with intercourse. Pap normal 2012, new screening guidelines reviewed.   Harrington ChallengerYOUNG,NANCY J B[facility] Surgery CenterWHNP, 9:32 AM 02/17/2014

## 2014-03-07 ENCOUNTER — Other Ambulatory Visit: Payer: Self-pay | Admitting: Gynecology

## 2014-03-07 DIAGNOSIS — Z780 Asymptomatic menopausal state: Secondary | ICD-10-CM

## 2014-03-09 DIAGNOSIS — M8580 Other specified disorders of bone density and structure, unspecified site: Secondary | ICD-10-CM

## 2014-03-09 HISTORY — DX: Other specified disorders of bone density and structure, unspecified site: M85.80

## 2014-03-22 ENCOUNTER — Ambulatory Visit (INDEPENDENT_AMBULATORY_CARE_PROVIDER_SITE_OTHER): Payer: Managed Care, Other (non HMO)

## 2014-03-22 ENCOUNTER — Other Ambulatory Visit: Payer: Self-pay | Admitting: Gynecology

## 2014-03-22 DIAGNOSIS — M899 Disorder of bone, unspecified: Secondary | ICD-10-CM

## 2014-03-22 DIAGNOSIS — M8580 Other specified disorders of bone density and structure, unspecified site: Secondary | ICD-10-CM

## 2014-03-22 DIAGNOSIS — Z780 Asymptomatic menopausal state: Secondary | ICD-10-CM

## 2014-03-22 DIAGNOSIS — Z8781 Personal history of (healed) traumatic fracture: Secondary | ICD-10-CM

## 2014-03-22 DIAGNOSIS — M949 Disorder of cartilage, unspecified: Secondary | ICD-10-CM

## 2014-03-22 DIAGNOSIS — Z13820 Encounter for screening for osteoporosis: Secondary | ICD-10-CM

## 2014-03-23 ENCOUNTER — Encounter: Payer: Self-pay | Admitting: Gynecology

## 2014-03-23 ENCOUNTER — Telehealth: Payer: Self-pay | Admitting: Gynecology

## 2014-03-23 ENCOUNTER — Other Ambulatory Visit: Payer: Self-pay | Admitting: Gynecology

## 2014-03-23 DIAGNOSIS — M8580 Other specified disorders of bone density and structure, unspecified site: Secondary | ICD-10-CM

## 2014-03-23 DIAGNOSIS — Z8781 Personal history of (healed) traumatic fracture: Secondary | ICD-10-CM

## 2014-03-23 NOTE — Telephone Encounter (Signed)
Tell patient that her bone density does show osteopenia. Not to the degree that she would need to take medicine but I would recommend having a vitamin D level checked to make sure that we cannot maximize this if low and to repeat the bone density in 2 years. Increasing weightbearing exercise such as walking no smoking and minimizing alcohol intake all help with osteopenia.

## 2014-03-23 NOTE — Telephone Encounter (Signed)
Pt informed with the below note, order placed. 

## 2014-03-28 ENCOUNTER — Other Ambulatory Visit: Payer: Managed Care, Other (non HMO)

## 2014-03-28 DIAGNOSIS — M8580 Other specified disorders of bone density and structure, unspecified site: Secondary | ICD-10-CM

## 2014-03-29 ENCOUNTER — Other Ambulatory Visit: Payer: Self-pay | Admitting: Gynecology

## 2014-03-29 DIAGNOSIS — E559 Vitamin D deficiency, unspecified: Secondary | ICD-10-CM

## 2014-03-29 LAB — VITAMIN D 25 HYDROXY (VIT D DEFICIENCY, FRACTURES): Vit D, 25-Hydroxy: 20 ng/mL — ABNORMAL LOW (ref 30–89)

## 2014-10-10 ENCOUNTER — Encounter: Payer: Self-pay | Admitting: Gynecology

## 2014-11-16 ENCOUNTER — Encounter: Payer: Self-pay | Admitting: Gynecology

## 2014-12-09 HISTORY — PX: HEMORRHOID SURGERY: SHX153

## 2015-05-19 ENCOUNTER — Ambulatory Visit (INDEPENDENT_AMBULATORY_CARE_PROVIDER_SITE_OTHER): Payer: Managed Care, Other (non HMO) | Admitting: Women's Health

## 2015-05-19 ENCOUNTER — Encounter: Payer: Self-pay | Admitting: Women's Health

## 2015-05-19 VITALS — BP 116/82 | Ht 60.0 in | Wt 158.4 lb

## 2015-05-19 DIAGNOSIS — Z01419 Encounter for gynecological examination (general) (routine) without abnormal findings: Secondary | ICD-10-CM

## 2015-05-19 NOTE — Progress Notes (Signed)
Melissa Duran [birth year] [id number]    History:    Presents for annual exam.  TAH with RSO for fibroids on no HRT. Normal Pap and mammogram history. Had cardiac surgery 2006. Hypertension/hypercholesterolemia primary care and cardiologist manage. 2009 negative colonoscopy. 03/2014 DEXA T score -2.1 hip average FRAX 4.8%/0.3%. Has had Pneumovax.  Past medical history, past surgical history, family history and social history were all reviewed and documented in the EPIC chart. Works for [employer]. Taking swim lessons trying to get increased exercise. Melissa Duran 24 graduated college and is moving to [place]. Mother and sister hypertension.  ROS:  A ROS was performed and pertinent positives and negatives are included.  Exam:  Filed Vitals:   05/19/15 0814  BP: 116/82    General appearance:  Normal Thyroid:  Symmetrical, normal in size, without palpable masses or nodularity. Respiratory  Auscultation:  Clear without wheezing or rhonchi Cardiovascular  Auscultation:  Regular rate, without rubs, murmurs or gallops  Edema/varicosities:  Not grossly evident Abdominal  Soft,nontender, without masses, guarding or rebound.  Liver/spleen:  No organomegaly noted  Hernia:  None appreciated  Skin  Inspection:  Grossly normal   Breasts: Examined lying and sitting.     Right: Without masses, retractions, discharge or axillary adenopathy.     Left: Without masses, retractions, discharge or axillary adenopathy. Gentitourinary   Inguinal/mons:  Normal without inguinal adenopathy  External genitalia:  Normal  BUS/Urethra/Skene's glands:  Normal  Vagina:  Normal  Cervix: And uterus absent  Adnexa/parametria:     Rt: Without masses or tenderness.   Lt: Without masses or tenderness.  Anus and perineum: Normal  Digital rectal exam: Normal sphincter tone without palpated masses or tenderness  Assessment/Plan:  59 y.o. SBF G 1 P1 for annual exam with no complaints.  TAH with RSO for fibroids no  HRT 2006 cardiac surgery repair heart defect Hypertension/hypercholesterolemia-primary care manages labs and meds Osteopenia without elevated FRAX  Plan: Reviewed importance of increasing regular weightbearing exercise, fall prevention, home safety reviewed. Continue vitamin D 2000 daily, calcium rich diet encouraged. SBE's, continue annual screening mammogram, 3-D tomography reviewed and encouraged history of dense breasts. UAHarrington Challenger [facility], 8:34 AM 05/19/2015

## 2015-05-19 NOTE — Addendum Note (Signed)
Addended by: Harrington Challenger on: 05/19/2015 09:21 AM   Modules accepted: Orders

## 2015-05-19 NOTE — Patient Instructions (Signed)
Health Recommendations for Postmenopausal Women Respected and ongoing research has looked at the most common causes of death, disability, and poor quality of life in postmenopausal women. The causes include heart disease, diseases of blood vessels, diabetes, depression, cancer, and bone loss (osteoporosis). Many things can be done to help lower the chances of developing these and other common problems. CARDIOVASCULAR DISEASE Heart Disease: A heart attack is a medical emergency. Know the signs and symptoms of a heart attack. Below are things women can do to reduce their risk for heart disease.   Do not smoke. If you smoke, quit.  Aim for a healthy weight. Being overweight causes many preventable deaths. Eat a healthy and balanced diet and drink an adequate amount of liquids.  Get moving. Make a commitment to be more physically active. Aim for 30 minutes of activity on most, if not all days of the week.  Eat for heart health. Choose a diet that is low in saturated fat and cholesterol and eliminate trans fat. Include whole grains, vegetables, and fruits. Read and understand the labels on food containers before buying.  Know your numbers. Ask your caregiver to check your blood pressure, cholesterol (total, HDL, LDL, triglycerides) and blood glucose. Work with your caregiver on improving your entire clinical picture.  High blood pressure. Limit or stop your table salt intake (try salt substitute and food seasonings). Avoid salty foods and drinks. Read labels on food containers before buying. Eating well and exercising can help control high blood pressure. STROKE  Stroke is a medical emergency. Stroke may be the result of a blood clot in a blood vessel in the brain or by a brain hemorrhage (bleeding). Know the signs and symptoms of a stroke. To lower the risk of developing a stroke:  Avoid fatty foods.  Quit smoking.  Control your diabetes, blood pressure, and irregular heart rate. THROMBOPHLEBITIS  (BLOOD CLOT) OF THE LEG  Becoming overweight and leading a stationary lifestyle may also contribute to developing blood clots. Controlling your diet and exercising will help lower the risk of developing blood clots. CANCER SCREENING  Breast Cancer: Take steps to reduce your risk of breast cancer.  You should practice "breast self-awareness." This means understanding the normal appearance and feel of your breasts and should include breast self-examination. Any changes detected, no matter how small, should be reported to your caregiver.  After age 57, you should have a clinical breast exam (CBE) every year.  Starting at age 57, you should consider having a mammogram (breast X-ray) every year.  If you have a family history of breast cancer, talk to your caregiver about genetic screening.  If you are at high risk for breast cancer, talk to your caregiver about having an MRI and a mammogram every year.  Intestinal or Stomach Cancer: Tests to consider are a rectal exam, fecal occult blood, sigmoidoscopy, and colonoscopy. Women who are high risk may need to be screened at an earlier age and more often.  Cervical Cancer:  Beginning at age 72, you should have a Pap test every 3 years as long as the past 3 Pap tests have been normal.  If you have had past treatment for cervical cancer or a condition that could lead to cancer, you need Pap tests and screening for cancer for at least 20 years after your treatment.  If you had a hysterectomy for a problem that was not cancer or a condition that could lead to cancer, then you no longer need Pap tests.  If you are between ages 65 and 70, and you have had normal Pap tests going back 10 years, you no longer need Pap tests.  If Pap tests have been discontinued, risk factors (such as a new sexual partner) need to be reassessed to determine if screening should be resumed.  Some medical problems can increase the chance of getting cervical cancer. In these  cases, your caregiver may recommend more frequent screening and Pap tests.  Uterine Cancer: If you have vaginal bleeding after reaching menopause, you should notify your caregiver.  Ovarian Cancer: Other than yearly pelvic exams, there are no reliable tests available to screen for ovarian cancer at this time except for yearly pelvic exams.  Lung Cancer: Yearly chest X-rays can detect lung cancer and should be done on high risk women, such as cigarette smokers and women with chronic lung disease (emphysema).  Skin Cancer: A complete body skin exam should be done at your yearly examination. Avoid overexposure to the sun and ultraviolet light lamps. Use a strong sun block cream when in the sun. All of these things are important for lowering the risk of skin cancer. MENOPAUSE Menopause Symptoms: Hormone therapy products are effective for treating symptoms associated with menopause:  Moderate to severe hot flashes.  Night sweats.  Mood swings.  Headaches.  Tiredness.  Loss of sex drive.  Insomnia.  Other symptoms. Hormone replacement carries certain risks, especially in older women. Women who use or are thinking about using estrogen or estrogen with progestin treatments should discuss that with their caregiver. Your caregiver will help you understand the benefits and risks. The ideal dose of hormone replacement therapy is not known. The Food and Drug Administration (FDA) has concluded that hormone therapy should be used only at the lowest doses and for the shortest amount of time to reach treatment goals.  OSTEOPOROSIS Protecting Against Bone Loss and Preventing Fracture If you use hormone therapy for prevention of bone loss (osteoporosis), the risks for bone loss must outweigh the risk of the therapy. Ask your caregiver about other medications known to be safe and effective for preventing bone loss and fractures. To guard against bone loss or fractures, the following is recommended:  If  you are younger than age 50, take 1000 mg of calcium and at least 600 mg of Vitamin D per day.  If you are older than age 50 but younger than age 70, take 1200 mg of calcium and at least 600 mg of Vitamin D per day.  If you are older than age 70, take 1200 mg of calcium and at least 800 mg of Vitamin D per day. Smoking and excessive alcohol intake increases the risk of osteoporosis. Eat foods rich in calcium and vitamin D and do weight bearing exercises several times a week as your caregiver suggests. DIABETES Diabetes Mellitus: If you have type I or type 2 diabetes, you should keep your blood sugar under control with diet, exercise, and recommended medication. Avoid starchy and fatty foods, and too many sweets. Being overweight can make diabetes control more difficult. COGNITION AND MEMORY Cognition and Memory: Menopausal hormone therapy is not recommended for the prevention of cognitive disorders such as Alzheimer's disease or memory loss.  DEPRESSION  Depression may occur at any age, but it is common in elderly women. This may be because of physical, medical, social (loneliness), or financial problems and needs. If you are experiencing depression because of medical problems and control of symptoms, talk to your caregiver about this. Physical   activity and exercise may help with mood and sleep. Community and volunteer involvement may improve your sense of value and worth. If you have depression and you feel that the problem is getting worse or becoming severe, talk to your caregiver about which treatment options are best for you. ACCIDENTS  Accidents are common and can be serious in elderly woman. Prepare your house to prevent accidents. Eliminate throw rugs, place hand bars in bath, shower, and toilet areas. Avoid wearing high heeled shoes or walking on wet, snowy, and icy areas. Limit or stop driving if you have vision or hearing problems, or if you feel you are unsteady with your movements and  reflexes. HEPATITIS C Hepatitis C is a type of viral infection affecting the liver. It is spread mainly through contact with blood from an infected person. It can be treated, but if left untreated, it can lead to severe liver damage over the years. Many people who are infected do not know that the virus is in their blood. If you are a "baby-boomer", it is recommended that you have one screening test for Hepatitis C. IMMUNIZATIONS  Several immunizations are important to consider having during your senior years, including:   Tetanus, diphtheria, and pertussis booster shot.  Influenza every year before the flu season begins.  Pneumonia vaccine.  Shingles vaccine.  Others, as indicated based on your specific needs. Talk to your caregiver about these. Document Released: 01/17/2006 Document Revised: 04/11/2014 Document Reviewed: 09/12/2008 ExitCare Patient Information 2015 [employer]. This information is not intended to replace advice given to you by your health care provider. Make sure you discuss any questions you have with your health care provider. Exercise to Stay Healthy Exercise helps you become and stay healthy. EXERCISE IDEAS AND TIPS Choose exercises that:  You enjoy.  Fit into your day. You do not need to exercise really hard to be healthy. You can do exercises at a slow or medium level and stay healthy. You can:  Stretch before and after working out.  Try yoga, Pilates, or tai chi.  Lift weights.  Walk fast, swim, jog, run, climb stairs, bicycle, dance, or rollerskate.  Take aerobic classes. Exercises that burn about 150 calories:  Running 1  miles in 15 minutes.  Playing volleyball for 45 to 60 minutes.  Washing and waxing a car for 45 to 60 minutes.  Playing touch football for 45 minutes.  Walking 1  miles in 35 minutes.  Pushing a stroller 1  miles in 30 minutes.  Playing basketball for 30 minutes.  Raking leaves for 30 minutes.  Bicycling 5  miles in 30 minutes.  Walking 2 miles in 30 minutes.  Dancing for 30 minutes.  Shoveling snow for 15 minutes.  Swimming laps for 20 minutes.  Walking up stairs for 15 minutes.  Bicycling 4 miles in 15 minutes.  Gardening for 30 to 45 minutes.  Jumping rope for 15 minutes.  Washing windows or floors for 45 to 60 minutes. Document Released: 12/28/2010 Document Revised: 02/17/2012 Document Reviewed: 12/28/2010 ExitCare Patient Information 2015 [employer]. This information is not intended to replace advice given to you by your health care provider. Make sure you discuss any questions you have with your health care provider.  

## 2015-05-20 LAB — URINALYSIS W MICROSCOPIC + REFLEX CULTURE
Bilirubin Urine: NEGATIVE
Casts: NONE SEEN
Glucose, UA: NEGATIVE mg/dL
Hgb urine dipstick: NEGATIVE
Ketones, ur: NEGATIVE mg/dL
Leukocytes, UA: NEGATIVE
Nitrite: NEGATIVE
Protein, ur: NEGATIVE mg/dL
Specific Gravity, Urine: 1.02 (ref 1.005–1.030)
Urobilinogen, UA: 1 mg/dL (ref 0.0–1.0)
pH: 5.5 (ref 5.0–8.0)

## 2015-05-22 LAB — URINE CULTURE: Colony Count: 30000

## 2015-08-08 ENCOUNTER — Encounter: Payer: Self-pay | Admitting: Neurology

## 2015-08-08 ENCOUNTER — Ambulatory Visit (INDEPENDENT_AMBULATORY_CARE_PROVIDER_SITE_OTHER): Payer: Managed Care, Other (non HMO) | Admitting: Neurology

## 2015-08-08 VITALS — BP 126/94 | HR 68 | Resp 20 | Ht 60.0 in | Wt 156.3 lb

## 2015-08-08 DIAGNOSIS — Q211 Atrial septal defect: Secondary | ICD-10-CM | POA: Diagnosis not present

## 2015-08-08 DIAGNOSIS — G43809 Other migraine, not intractable, without status migrainosus: Secondary | ICD-10-CM

## 2015-08-08 DIAGNOSIS — I679 Cerebrovascular disease, unspecified: Secondary | ICD-10-CM | POA: Diagnosis not present

## 2015-08-08 DIAGNOSIS — G43109 Migraine with aura, not intractable, without status migrainosus: Secondary | ICD-10-CM

## 2015-08-08 DIAGNOSIS — Q2112 Patent foramen ovale: Secondary | ICD-10-CM | POA: Insufficient documentation

## 2015-08-08 NOTE — Patient Instructions (Signed)
I think the visual disturbance is migraine Continue aspirin  daily Follow up as needed.

## 2015-08-08 NOTE — Progress Notes (Signed)
NEUROLOGY CONSULTATION NOTE  Melissa Duran MRN: [id number] DOB: 05-Dec-1956  Referring provider: Dr. Cliffton Asters Primary care provider: Dr. Cliffton Asters  Reason for consult:  Visual loss  HISTORY OF PRESENT ILLNESS: Melissa Duran is a 59 year old right-handed woman with hypertension, hypercholesterolemia, GERD, and history of cardio-embolic stroke status post PFO repair who presents for vision loss.  History obtained from patient, PCP note and prior neurologist's note reviewed.  Labs and imaging of brain MRIs from 2008 and 2013 personally reviewed.  She has a history of headaches since college.  They are bi-frontal, pounding and associated with photophobia and phonophobia, but not nausea.  About 10 years ago, she began having unilateral transient vision loss.  She described it as a curtain coming down and it looked like she was looking through sunglasses.  It initially would occur on either side.  TIA was suspected.  She had an MRI of the brain on 03/07/07, which showed nonspecific periventricular and subcortical white matter hyperintensities.  MRA of head and neck showed no focal stenosis.  She was started on Aggrenox.  She was found to have a PFO and underwent closure in 2009 by Dr. Jacinto Halim.  In further evaluating the white matter hyperintensities, she had a repeat MRI of the brain with and without contrast in 2013, which showed minimal progression.  Carotid US from 2013 showed no hemodynamically significant stenosis.  About 5 years ago, they began to predominantly involve the right eye.  There is some associated retro-orbital ache.  It tends to be triggered when she briefly looks into sunlight.  They usually last one minute, but it has lasted up to 5 minutes.  They previously would occur once in a while, but over the past several months, they have been occuring several times a month.  Due to increased frequency of visual disturbance, she was started on propranolol ER  daily for possible migraine.   Aggrenox was discontinued, and she is now taking ASA  daily.  PAST MEDICAL HISTORY: Past Medical History  Diagnosis Date  . Hypercholesteremia   . Hypertension   . Headache(784.0)   . Osteopenia 03/2014     T score -2.1 FRAX 4.8%/0.3%  . Multiple sclerosis     PAST SURGICAL HISTORY: Past Surgical History  Procedure Laterality Date  . Cardiac surgery  01/25/2005    cardioSEAL implant--Septal Occluder Implant  . Oophorectomy      rso  . Total abdominal hysterectomy  03/24/01    RSO  . Hemorrhoid surgery  2016    MEDICATIONS: Current Outpatient Prescriptions on File Prior to Visit  Medication Sig Dispense Refill  . dipyridamole-aspirin (AGGRENOX) 25-200 MG per 12 hr capsule Take 1 capsule by mouth 2 (two) times daily.      . ferrous sulfate 325 (65 FE) MG tablet Take 325 mg by mouth daily.      Marland Kitchen lisinopril-hydrochlorothiazide (PRINZIDE,ZESTORETIC) 20-25 MG per tablet Take 1 tablet by mouth daily.    . Multiple Vitamin (MULTIVITAMIN PO) Take by mouth daily.      . rosuvastatin (CRESTOR) 20 MG tablet Take 20 mg by mouth daily. Take 1/2 tab daily       No current facility-administered medications on file prior to visit.    ALLERGIES: No Known Allergies  FAMILY HISTORY: Family History  Problem Relation Age of Onset  . Hypertension Mother   . Cataracts Mother   . Hypertension Sister   . Cataracts Father   . Cancer Sister     SOCIAL HISTORY:  Social History   Social History  . Marital Status: Single    Spouse Name: N/A  . Number of Children: 1  . Years of Education: N/A   Occupational History  . 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 Smoking status: Former Smoker    Quit date: 12/09/1973  . Smokeless tobacco: Never Used  . Alcohol Use: Yes     Comment: one a week  . Drug Use: No  . Sexual Activity:    Partners: Male    Birth Control/ Protection: Surgical   Other Topics Concern  . Not on file   Social History  Narrative   Patient drinks 2 sodas daily.    REVIEW OF SYSTEMS: Constitutional: No fevers, chills, or sweats, no generalized fatigue, change in appetite Eyes: No visual changes, double vision, eye pain Ear, nose and throat: No hearing loss, ear pain, nasal congestion, sore throat Cardiovascular: No chest pain, palpitations Respiratory:  No shortness of breath at rest or with exertion, wheezes GastrointestinaI: No nausea, vomiting, diarrhea, abdominal pain, fecal incontinence Genitourinary:  No dysuria, urinary retention or frequency Musculoskeletal:  No neck pain, back pain Integumentary: No rash, pruritus, skin lesions Neurological: as above Psychiatric: No depression, insomnia, anxiety Endocrine: No palpitations, fatigue, diaphoresis, mood swings, change in appetite, change in weight, increased thirst Hematologic/Lymphatic:  No anemia, purpura, petechiae. Allergic/Immunologic: no itchy/runny eyes, nasal congestion, recent allergic reactions, rashes  PHYSICAL EXAM: Filed Vitals:   08/08/15 1431  BP: 126/94  Pulse: 68  Resp: 20   General: No acute distress.  Patient appears well-groomed. Head:  Normocephalic/atraumatic Eyes:  fundi unremarkable, without vessel changes, exudates, hemorrhages or papilledema. Neck: supple, no paraspinal tenderness, full range of motion Back: No paraspinal tenderness Heart: regular rate and rhythm Lungs: Clear to auscultation bilaterally. Vascular: No carotid bruits. Neurological Exam: Mental status: alert and oriented to person, place, and time, recent and remote memory intact, fund of knowledge intact, attention and concentration intact, speech fluent and not dysarthric, language intact. Cranial nerves: CN I: not tested CN II: pupils equal, round and reactive to light, visual fields intact, fundi unremarkable, without vessel changes, exudates, hemorrhages or papilledema. CN III, IV, VI:  full range of motion, no nystagmus, no ptosis CN V:  facial sensation intact CN VII: upper and lower face symmetric CN VIII: hearing intact CN IX, X: gag intact, uvula midline CN XI: sternocleidomastoid and trapezius muscles intact CN XII: tongue midline Bulk & Tone: normal, no fasciculations. Motor:  5/5 throughout. Sensation:  temperature and vibration sensation intact. Deep Tendon Reflexes:  2+ throughout, toes downgoing.  Finger to nose testing:  Without dysmetria.  Heel to shin:  Without dysmetria.  Gait:  Normal station and stride.  Able to turn and tandem walk. Romberg negative.  IMPRESSION: Recurrent transient visual loss.  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  Given the longstanding history of these recurrent spells, I do not think they are TIAs/amaurosis fugax.  Cerebrovascular disease PFO status post closure  PLAN: 1.  To try and reduce frequency of the visual aura, I suggested increasing propranolol, but she would not like to do this at this time. 2.  She should continue ASA 81mg  daily 3.  Follow up as needed.  Thank you for allowing me to take part in the care of this patient.  Shon Millet, DO  CC:  Laurann Montana, MD

## 2015-10-27 ENCOUNTER — Ambulatory Visit (INDEPENDENT_AMBULATORY_CARE_PROVIDER_SITE_OTHER): Payer: Managed Care, Other (non HMO) | Admitting: Neurology

## 2015-10-27 ENCOUNTER — Encounter: Payer: Self-pay | Admitting: Neurology

## 2015-10-27 VITALS — BP 130/74 | HR 82 | Ht 60.0 in | Wt 156.0 lb

## 2015-10-27 DIAGNOSIS — G43109 Migraine with aura, not intractable, without status migrainosus: Secondary | ICD-10-CM

## 2015-10-27 DIAGNOSIS — G43809 Other migraine, not intractable, without status migrainosus: Secondary | ICD-10-CM

## 2015-10-27 DIAGNOSIS — R930 Abnormal findings on diagnostic imaging of skull and head, not elsewhere classified: Secondary | ICD-10-CM | POA: Diagnosis not present

## 2015-10-27 DIAGNOSIS — I679 Cerebrovascular disease, unspecified: Secondary | ICD-10-CM

## 2015-10-27 MED ORDER — PROPRANOLOL HCL ER 120 MG PO CP24: 120.0000 mg | ORAL_CAPSULE | Freq: Every day | ORAL | Status: DC

## 2015-10-27 MED ORDER — VALIUM 5 MG PO TABS: ORAL_TABLET | ORAL | Status: DC

## 2015-10-27 NOTE — Progress Notes (Signed)
NEUROLOGY FOLLOW UP OFFICE NOTE  Melissa Duran [id number]  HISTORY OF PRESENT ILLNESS: Melissa Duran is a 59 year old right-handed woman with hypertension, hypercholesterolemia, GERD, and history of cardio-embolic stroke status post PFO repair who follows up for ocular migraine.  UPDATE: She has remained on propranolol ER 80mg  daily and ocular migraines have now become daily and occur inside the house as well.  There is also associated right-sided head pressure.  HISTORY: She has a history of headaches since college.  They are bi-frontal, pounding and associated with photophobia and phonophobia, but not nausea.  About 10 years ago, she began having unilateral transient vision loss.  She described it as a curtain coming down and it looked like she was looking through sunglasses.  It initially would occur on either side.  TIA was suspected.  She had an MRI of the brain on 03/07/07, which showed nonspecific periventricular and subcortical white matter hyperintensities.  Follow-up MRI of brain from 12/09/12 showed mild progression of white matter abnormalities.  MRA of head and neck showed no focal stenosis.  She was started on Aggrenox.  She was found to have a PFO and underwent closure in 2009 by Dr. Jacinto HalimGanji.  In further evaluating the white matter hyperintensities, she had a repeat MRI of the brain with and without contrast in 2013, which showed minimal progression.  Carotid US from 2013 showed no hemodynamically significant stenosis.  About 5 years ago, they began to predominantly involve the right eye.  There is some associated retro-orbital ache.  It tends to be triggered when she briefly looks into sunlight.  They usually last one minute, but it has lasted up to 5 minutes.    Due to increased frequency of visual disturbance, she was started on propranolol ER 80mg  daily for possible migraine.  Aggrenox was discontinued, and she is now taking ASA 81mg  daily.  PAST MEDICAL HISTORY: Past Medical  History  Diagnosis Date  . Hypercholesteremia   . Hypertension   . Headache(784.0)   . Osteopenia 03/2014     T score -2.1 FRAX 4.8%/0.3%  . Multiple sclerosis [facility])     MEDICATIONS: Current Outpatient Prescriptions on File Prior to Visit  Medication Sig Dispense Refill  . ferrous sulfate 325 (65 FE) MG tablet Take 325 mg by mouth daily.      Marland Kitchen. HYDROcodone-acetaminophen (NORCO/VICODIN) 5-325 MG per tablet TK 1 T PO Q 4 H PRN  0  . lisinopril-hydrochlorothiazide (PRINZIDE,ZESTORETIC) 20-25 MG per tablet Take 1 tablet by mouth daily.    . Multiple Vitamin (MULTIVITAMIN PO) Take by mouth daily.      . rosuvastatin (CRESTOR) 10 MG tablet      No current facility-administered medications on file prior to visit.    ALLERGIES: No Known Allergies  FAMILY HISTORY: Family History  Problem Relation Age of Onset  . Hypertension Mother   . Cataracts Mother   . Hypertension Sister   . Cataracts Father   . Cancer Sister     SOCIAL HISTORY: Social History   Social History  . Marital Status: Single    Spouse Name: N/A  . Number of Children: 1  . Years of Education: N/A   Occupational History  . 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 Smoking status: Former Smoker    Quit date: 12/09/1973  . Smokeless tobacco: Never Used  . Alcohol Use: Yes     Comment: one a week  . Drug Use: No  .  Sexual Activity:    Partners: Male    Pharmacist, hospital Protection: Surgical   Other Topics Concern  . Not on file   Social History Narrative   Patient drinks 2 sodas daily.    REVIEW OF SYSTEMS: Constitutional: No fevers, chills, or sweats, no generalized fatigue, change in appetite Eyes: No visual changes, double vision, eye pain Ear, nose and throat: No hearing loss, ear pain, nasal congestion, sore throat Cardiovascular: No chest pain, palpitations Respiratory:  No shortness of breath at rest or with exertion, wheezes GastrointestinaI: No nausea,  vomiting, diarrhea, abdominal pain, fecal incontinence Genitourinary:  No dysuria, urinary retention or frequency Musculoskeletal:  No neck pain, back pain Integumentary: No rash, pruritus, skin lesions Neurological: as above Psychiatric: No depression, insomnia, anxiety Endocrine: No palpitations, fatigue, diaphoresis, mood swings, change in appetite, change in weight, increased thirst Hematologic/Lymphatic:  No anemia, purpura, petechiae. Allergic/Immunologic: no itchy/runny eyes, nasal congestion, recent allergic reactions, rashes  PHYSICAL EXAM: Filed Vitals:   10/27/15 1535  BP: 130/74  Pulse: 82   General: No acute distress.  Patient appears well-groomed.  normal body habitus. Head:  Normocephalic/atraumatic Eyes:  Fundoscopic exam unremarkable without vessel changes, exudates, hemorrhages or papilledema. Neck: supple, no paraspinal tenderness, full range of motion Heart:  Regular rate and rhythm Lungs:  Clear to auscultation bilaterally Back: No paraspinal tenderness Neurological Exam: alert and oriented to person, place, and time. Attention span and concentration intact, recent and remote memory intact, fund of knowledge intact.  Speech fluent and not dysarthric, language intact.  CN II-XII intact. Fundoscopic exam unremarkable without vessel changes, exudates, hemorrhages or papilledema.  Bulk and tone normal, muscle strength 5/5 throughout.  Sensation to light touch intact.  Deep tendon reflexes 2+ throughout.  Finger to nose testing intact.  Gait normal, Romberg negative.  IMPRESSION: Ocular migraines, now daily Cerebrovascular disease  PLAN: 1.  Increase propranolol ER to  daily.  She will call in 4 weeks with update. 2.  MRI of brain with and without contrast to evaluate for any significant changes since visual episodes have increased from once in a while to daily in the course of a year. 3.  ASA  daily 4.  Follow up in 3 months.  15 minutes spent face to face  with patient, over 50% spent discussing management.  Shon Millet, DO  CC:  Laurann Montana, MD

## 2015-10-27 NOTE — Progress Notes (Signed)
Chart forwarded.  

## 2015-10-27 NOTE — Addendum Note (Signed)
Addended by: Sheilah MinsFOX, JADA A on: 10/27/2015 04:14 PM   Modules accepted: Orders

## 2015-10-27 NOTE — Patient Instructions (Addendum)
1.  Start propranolol ER 120mg  daily.  Keep a diary and call in 4 weeks with update.  We can increase or change medication if needed. 2.  Since these are now occuring daily, we will repeat MRI of brain with and without contrast. Will need to have driver with you. Medication can make you drowsy 3.  Follow up in 3 months BUT CALL IN 4 WEEKS WITH UPDATE.

## 2015-10-30 NOTE — Progress Notes (Signed)
Chart forwarded.  

## 2015-11-15 ENCOUNTER — Ambulatory Visit
Admission: RE | Admit: 2015-11-15 | Discharge: 2015-11-15 | Disposition: A | Discharge status: Home / Self Care | Payer: Managed Care, Other (non HMO) | Source: Ambulatory Visit | Attending: Neurology | Admitting: Neurology

## 2015-11-15 ENCOUNTER — Telehealth: Payer: Self-pay

## 2015-11-15 DIAGNOSIS — R930 Abnormal findings on diagnostic imaging of skull and head, not elsewhere classified: Secondary | ICD-10-CM

## 2015-11-15 DIAGNOSIS — I679 Cerebrovascular disease, unspecified: Secondary | ICD-10-CM

## 2015-11-15 DIAGNOSIS — G43109 Migraine with aura, not intractable, without status migrainosus: Secondary | ICD-10-CM

## 2015-11-15 MED ORDER — GADOBENATE DIMEGLUMINE 529 MG/ML IV SOLN
14.0000 mL | Freq: Once | INTRAVENOUS | Status: AC | PRN
  Administered 2015-11-15: 14 mL via INTRAVENOUS

## 2015-11-15 NOTE — Telephone Encounter (Signed)
Message left on vm 

## 2015-11-15 NOTE — Telephone Encounter (Signed)
-----   Message from Drema DallasAdam R Jaffe, DO sent at 11/15/2015  1:04 PM EST ----- MRI shows nothing concerning that would be causing increased headaches.

## 2015-12-05 ENCOUNTER — Other Ambulatory Visit: Payer: Self-pay

## 2015-12-05 MED ORDER — PROPRANOLOL HCL ER 120 MG PO CP24: 120.0000 mg | ORAL_CAPSULE | Freq: Every day | ORAL | Status: DC

## 2015-12-08 ENCOUNTER — Ambulatory Visit: Payer: Managed Care, Other (non HMO) | Admitting: Neurology

## 2015-12-15 ENCOUNTER — Ambulatory Visit (INDEPENDENT_AMBULATORY_CARE_PROVIDER_SITE_OTHER): Payer: Managed Care, Other (non HMO) | Admitting: Neurology

## 2015-12-15 ENCOUNTER — Encounter: Payer: Self-pay | Admitting: Neurology

## 2015-12-15 VITALS — BP 128/84 | HR 60 | Ht 60.0 in | Wt 160.0 lb

## 2015-12-15 DIAGNOSIS — I679 Cerebrovascular disease, unspecified: Secondary | ICD-10-CM

## 2015-12-15 DIAGNOSIS — G43809 Other migraine, not intractable, without status migrainosus: Secondary | ICD-10-CM | POA: Diagnosis not present

## 2015-12-15 DIAGNOSIS — G43109 Migraine with aura, not intractable, without status migrainosus: Secondary | ICD-10-CM

## 2015-12-15 NOTE — Progress Notes (Signed)
Chart forwarded.  

## 2015-12-15 NOTE — Progress Notes (Signed)
NEUROLOGY FOLLOW UP OFFICE NOTE  Melissa Duran [id number]  HISTORY OF PRESENT ILLNESS: Melissa Duran is a 60 year old right-handed woman with hypertension, hypercholesterolemia, GERD, and history of cardio-embolic stroke status post PFO repair who follows up for ocular migraine.  Imaging of brain MRI from last month reviewed.  UPDATE: Ocular migraines had become daily, so propranolol ER was increased to 120mg  daily.  Since the increase in propranolol, they have reduced to once a week to once every other week.  They are manageable.  MRI of the brain with and without contrast performed on 11/15/15 showed stable nonspecific cerebral white matter disease but no acute intracranial process or mass lesion.  HISTORY: She has a history of headaches since college.  They are bi-frontal, pounding and associated with photophobia and phonophobia, but not nausea.  About 10 years ago, she began having unilateral transient vision loss.  She described it as a curtain coming down and it looked like she was looking through sunglasses.  It initially would occur on either side.  TIA was suspected.  She had an MRI of the brain on 03/07/07, which showed nonspecific periventricular and subcortical white matter hyperintensities.  Follow-up MRI of brain from 12/09/12 showed mild progression of white matter abnormalities.  MRA of head and neck showed no focal stenosis.  She was started on Aggrenox.  She was found to have a PFO and underwent closure in 2009 by Dr. Jacinto Halim.  In further evaluating the white matter hyperintensities, she had a repeat MRI of the brain with and without contrast in 2013, which showed minimal progression.  Carotid US from 2013 showed no hemodynamically significant stenosis.  About 5 years ago, they began to predominantly involve the right eye.  There is some associated retro-orbital ache.  It tends to be triggered when she briefly looks into sunlight.  They usually last one minute, but it has lasted up to 5  minutes.    Due to increased frequency of visual disturbance, she was started on propranolol for possible migraine.  Aggrenox was discontinued, and she is now taking ASA 81mg  daily.  PAST MEDICAL HISTORY: Past Medical History  Diagnosis Date  . Hypercholesteremia   . Hypertension   . Headache(784.0)   . Osteopenia 03/2014     T score -2.1 FRAX 4.8%/0.3%  . Multiple sclerosis [facility])     MEDICATIONS: Current Outpatient Prescriptions on File Prior to Visit  Medication Sig Dispense Refill  . ferrous sulfate 325 (65 FE) MG tablet Take 325 mg by mouth daily.      Marland Kitchen lisinopril-hydrochlorothiazide (PRINZIDE,ZESTORETIC) 20-25 MG per tablet Take 1 tablet by mouth daily.    . propranolol ER (INDERAL LA) 120 MG 24 hr capsule Take 1 capsule (120 mg total) by mouth daily. 90 capsule 0  . rosuvastatin (CRESTOR) 10 MG tablet     . Multiple Vitamin (MULTIVITAMIN PO) Take by mouth daily. Reported on 12/15/2015     No current facility-administered medications on file prior to visit.    ALLERGIES: No Known Allergies  FAMILY HISTORY: Family History  Problem Relation Age of Onset  . Hypertension Mother   . Cataracts Mother   . Hypertension Sister   . Cataracts Father   . Cancer Sister     SOCIAL HISTORY: Social History   Social History  . Marital Status: Single    Spouse Name: N/A  . Number of Children: 1  . Years of Education: N/A   Occupational History  . Quality Analyst 61YRS [employer]   Social History Main Topics  . Smoking status: Former Smoker    Quit date: 12/09/1973  . Smokeless tobacco: Never Used  . Alcohol Use: Yes     Comment: one a week  . Drug Use: No  . Sexual Activity:    Partners: Male    Birth Control/ Protection: Surgical   Other Topics Concern  . Not on file   Social History Narrative   Patient drinks 2 sodas daily.    REVIEW OF SYSTEMS: Constitutional: No fevers, chills, or sweats, no generalized fatigue, change in appetite Eyes: No visual  changes, double vision, eye pain Ear, nose and throat: No hearing loss, ear pain, nasal congestion, sore throat Cardiovascular: No chest pain, palpitations Respiratory:  No shortness of breath at rest or with exertion, wheezes GastrointestinaI: No nausea, vomiting, diarrhea, abdominal pain, fecal incontinence Genitourinary:  No dysuria, urinary retention or frequency Musculoskeletal:  No neck pain, back pain Integumentary: No rash, pruritus, skin lesions Neurological: as above Psychiatric: No depression, insomnia, anxiety Endocrine: No palpitations, fatigue, diaphoresis, mood swings, change in appetite, change in weight, increased thirst Hematologic/Lymphatic:  No anemia, purpura, petechiae. Allergic/Immunologic: no itchy/runny eyes, nasal congestion, recent allergic reactions, rashes  PHYSICAL EXAM: Filed Vitals:   12/15/15 1521  BP: 128/84  Pulse: 60   General: No acute distress.  Patient appears well-groomed.   Head:  Normocephalic/atraumatic Eyes:  Fundoscopic exam unremarkable without vessel changes, exudates, hemorrhages or papilledema. Neck: supple, no paraspinal tenderness, full range of motion Heart:  Regular rate and rhythm Lungs:  Clear to auscultation bilaterally Back: No paraspinal tenderness Neurological Exam: alert and oriented to person, place, and time. Attention span and concentration intact, recent and remote memory intact, fund of knowledge intact.  Speech fluent and not dysarthric, language intact.  CN II-XII intact. Fundoscopic exam unremarkable without vessel changes, exudates, hemorrhages or papilledema.  Bulk and tone normal, muscle strength 5/5 throughout.  Sensation to light touch intact.  Deep tendon reflexes 2+ throughout.  Finger to nose testing intact.  Gait normal  IMPRESSION: Ocular migraine Cerebrovascular disease  PLAN: Propranolol ER 120mg  daily ASA 81mg  daily  Shon MilletAdam Karletta Millay, DO  CC:  Johny BlamerWilliam Harris, MD

## 2015-12-22 ENCOUNTER — Other Ambulatory Visit: Payer: Self-pay | Admitting: Family Medicine

## 2015-12-22 ENCOUNTER — Ambulatory Visit
Admission: RE | Admit: 2015-12-22 | Discharge: 2015-12-22 | Disposition: A | Discharge status: Home / Self Care | Payer: Managed Care, Other (non HMO) | Source: Ambulatory Visit | Attending: Family Medicine | Admitting: Family Medicine

## 2015-12-22 DIAGNOSIS — M2550 Pain in unspecified joint: Secondary | ICD-10-CM

## 2016-01-04 ENCOUNTER — Ambulatory Visit (INDEPENDENT_AMBULATORY_CARE_PROVIDER_SITE_OTHER): Payer: Managed Care, Other (non HMO) | Admitting: Women's Health

## 2016-01-04 ENCOUNTER — Encounter: Payer: Self-pay | Admitting: Women's Health

## 2016-01-04 VITALS — BP 130/82 | Ht 60.0 in | Wt 158.0 lb

## 2016-01-04 DIAGNOSIS — Z01419 Encounter for gynecological examination (general) (routine) without abnormal findings: Secondary | ICD-10-CM | POA: Diagnosis not present

## 2016-01-04 NOTE — Progress Notes (Signed)
Melissa Duran [birth year] [id number]    History:    Presents for annual exam.  1999 TAH and RSO for fibroids on no HRT. Not sexually active. Normal Pap and mammogram history. 03/2014 osteopenia hip -2 FRAX 4.8%/0.3%.  History of a CVA, hypertension and hypercholesterolemia primary care manages. History of a PFO. Ocular migraines. Negative colonoscopy age 60.  Past medical history, past surgical history, family history and social history were all reviewed and documented in the EPIC chart. Currently unemployed took early retirement from [employer]. Son graduated from college this past year doing well.  ROS:  A ROS was performed and pertinent positives and negatives are included.  Exam:  Filed Vitals:   01/04/16 0836  BP: 130/82    General appearance:  Normal Thyroid:  Symmetrical, normal in size, without palpable masses or nodularity. Respiratory  Auscultation:  Clear without wheezing or rhonchi Cardiovascular  Auscultation:  Regular rate, without rubs, murmurs or gallops  Edema/varicosities:  Not grossly evident Abdominal  Soft,nontender, without masses, guarding or rebound.  Liver/spleen:  No organomegaly noted  Hernia:  None appreciated  Skin  Inspection:  Grossly normal   Breasts: Examined lying and sitting.     Right: Without masses, retractions, discharge or axillary adenopathy.     Left: Without masses, retractions, discharge or axillary adenopathy. Gentitourinary   Inguinal/mons:  Normal without inguinal adenopathy  External genitalia:  Normal  BUS/Urethra/Skene's glands:  Normal  Vagina:  Normal  Cervix: Absent  Uterus:  Absent  Adnexa/parametria:     Rt: Without masses or tenderness.   Lt: Without masses or tenderness.  Anus and perineum: Normal  Digital rectal exam: Normal sphincter tone without palpated masses or tenderness  Assessment/Plan:  60 y.o. SBF G1P1 for annual exam no complaints.     1999 TAH with RSO for fibroids on no  HRT CVA/hypertension/hypercholesterolemia/migraines-primary care and cardiologist manages labs and meds Osteopenia without elevated FRAX  Plan: SBE's, continue annual screening mammogram 3-D tomography reviewed and encouraged history of dense breasts. Increase regular exercise, calcium rich diet, vitamin D 1000 daily encouraged. Home safety, fall prevention and importance of weightbearing exercise reviewed. Repeat DEXA this year. UAHarrington Challenger WHNP, 9:25 AM 01/04/2016

## 2016-01-04 NOTE — Patient Instructions (Signed)

## 2016-01-05 LAB — URINALYSIS W MICROSCOPIC + REFLEX CULTURE
Bilirubin Urine: NEGATIVE
Casts: NONE SEEN [LPF]
Crystals: NONE SEEN [HPF]
Glucose, UA: NEGATIVE
Hgb urine dipstick: NEGATIVE
Ketones, ur: NEGATIVE
Leukocytes, UA: NEGATIVE
Nitrite: NEGATIVE
Protein, ur: NEGATIVE
Specific Gravity, Urine: 1.023 (ref 1.001–1.035)
Yeast: NONE SEEN [HPF]
pH: 6 (ref 5.0–8.0)

## 2016-01-06 LAB — URINE CULTURE
Colony Count: NO GROWTH
Organism ID, Bacteria: NO GROWTH

## 2016-01-17 ENCOUNTER — Encounter: Payer: Self-pay | Admitting: Neurology

## 2016-01-18 ENCOUNTER — Encounter: Payer: Self-pay | Admitting: Neurology

## 2016-02-22 ENCOUNTER — Ambulatory Visit: Payer: Managed Care, Other (non HMO) | Admitting: Neurology

## 2016-03-04 ENCOUNTER — Other Ambulatory Visit: Payer: Self-pay | Admitting: Neurology

## 2016-03-04 NOTE — Telephone Encounter (Signed)
Last OV: 12/15/15 Next OV: 12/16/16

## 2016-07-15 ENCOUNTER — Other Ambulatory Visit: Payer: Self-pay

## 2016-07-15 MED ORDER — PROPRANOLOL HCL ER 120 MG PO CP24: 120.0000 mg | ORAL_CAPSULE | Freq: Every day | ORAL | 3 refills | Status: DC

## 2016-08-28 ENCOUNTER — Other Ambulatory Visit: Payer: Self-pay

## 2016-08-28 MED ORDER — PROPRANOLOL HCL ER 120 MG PO CP24: 120.0000 mg | ORAL_CAPSULE | Freq: Every day | ORAL | 3 refills | Status: AC

## 2016-12-16 ENCOUNTER — Ambulatory Visit: Payer: Managed Care, Other (non HMO) | Admitting: Neurology

## 2017-02-04 ENCOUNTER — Ambulatory Visit: Payer: Managed Care, Other (non HMO) | Admitting: Neurology

## 2017-03-19 ENCOUNTER — Encounter: Payer: Managed Care, Other (non HMO) | Admitting: Women's Health

## 2017-03-26 ENCOUNTER — Encounter: Payer: Self-pay | Admitting: Women's Health

## 2017-03-26 ENCOUNTER — Ambulatory Visit (INDEPENDENT_AMBULATORY_CARE_PROVIDER_SITE_OTHER): Payer: 59 | Admitting: Women's Health

## 2017-03-26 VITALS — BP 124/76 | Ht 60.0 in | Wt 160.0 lb

## 2017-03-26 DIAGNOSIS — Z01419 Encounter for gynecological examination (general) (routine) without abnormal findings: Secondary | ICD-10-CM | POA: Diagnosis not present

## 2017-03-26 DIAGNOSIS — Z13820 Encounter for screening for osteoporosis: Secondary | ICD-10-CM

## 2017-03-26 NOTE — Progress Notes (Signed)
Melissa Duran [birth year] [id number]    History:    Presents for annual exam with no complaints. 1999 TAH and RSO for fibroids on no HRT. Not sexually active over 1 year/same. Normal Pap and mammogram history. 03/2014 osteopenia hip -2 FRAX 4.8%/0.3%. History of a CVA, hypertension, and hypercholesterolemia primary care manages. History of a PFO. Ocular migraines. Negative colonoscopy age 61. [place] for mammogram and DEXA.   Past medical history, past surgical history, family history and social history were all reviewed and documented in the EPIC chart. Retired from [employer], in Engineer, maintenance (IT) at [employer], planning to leave. Takes care of nephew every day after school.  ROS:  A ROS was performed and pertinent positives and negatives are included.  Exam:  Vitals:   03/26/17 1136  BP: 124/76  Weight: 160 lb (72.6 kg)  Height: 5' (1.524 m)   Body mass index is 31.25 kg/m.   General appearance:  Normal Thyroid:  Symmetrical, normal in size, without palpable masses or nodularity. Respiratory  Auscultation:  Clear without wheezing or rhonchi Cardiovascular  Auscultation:  Regular rate, without rubs, murmurs or gallops  Edema/varicosities:  Not grossly evident Abdominal  Soft,nontender, without masses, guarding or rebound.  Liver/spleen:  No organomegaly noted  Hernia:  None appreciated  Skin  Inspection:  Grossly normal   Breasts: Examined lying and sitting.     Right: Without masses, retractions, discharge or axillary adenopathy.     Left: Without masses, retractions, discharge or axillary adenopathy. Gentitourinary   Inguinal/mons:  Normal without inguinal adenopathy  External genitalia:  Normal  BUS/Urethra/Skene's glands:  Normal  Vagina:  Normal  Cervix:  Absent  Uterus:  Absent  Adnexa/parametria:     Rt: Without masses or tenderness.   Lt: Without masses or tenderness.  Anus and perineum: Normal  Digital rectal exam: Normal sphincter tone without palpated masses or  tenderness  Assessment/Plan:  61 y.o.  SBF G1P1 for annual exam with no complaints.  1999 TAH with RSO for fibroids on no HRT CVA/Hypertension/Hypercholesterolemia/ocular migraines - primary care and cardiologist manages labs and meds Osteopenia without elevated FRAX Obesity  Plan: SBE's, reviewed importance of  annual screening mammogram 3-D tomography for history of dense breasts. Schedule DEXA scan this year. Increase regular exercise, decrease sugary beverages, calories and caffeine, calcium rich diet, vitamin D 2000 daily encouraged. Home safety, fall prevention, and importance of weightbearing exercise reviewed. Screening guidelines reviewed.    Harrington Challenger [facility], 12:37 PM 03/26/2017

## 2017-03-26 NOTE — Patient Instructions (Signed)
Health Maintenance for Postmenopausal Women Menopause is a normal process in which your reproductive ability comes to an end. This process happens gradually over a span of months to years, usually between the ages of 33 and 38. Menopause is complete when you have missed 12 consecutive menstrual periods. It is important to talk with your health care provider about some of the most common conditions that affect postmenopausal women, such as heart disease, cancer, and bone loss (osteoporosis). Adopting a healthy lifestyle and getting preventive care can help to promote your health and wellness. Those actions can also lower your chances of developing some of these common conditions. What should I know about menopause? During menopause, you may experience a number of symptoms, such as:  Moderate-to-severe hot flashes.  Night sweats.  Decrease in sex drive.  Mood swings.  Headaches.  Tiredness.  Irritability.  Memory problems.  Insomnia. Choosing to treat or not to treat menopausal changes is an individual decision that you make with your health care provider. What should I know about hormone replacement therapy and supplements? Hormone therapy products are effective for treating symptoms that are associated with menopause, such as hot flashes and night sweats. Hormone replacement carries certain risks, especially as you become older. If you are thinking about using estrogen or estrogen with progestin treatments, discuss the benefits and risks with your health care provider. What should I know about heart disease and stroke? Heart disease, heart attack, and stroke become more likely as you age. This may be due, in part, to the hormonal changes that your body experiences during menopause. These can affect how your body processes dietary fats, triglycerides, and cholesterol. Heart attack and stroke are both medical emergencies. There are many things that you can do to help prevent heart disease  and stroke:  Have your blood pressure checked at least every 1-2 years. High blood pressure causes heart disease and increases the risk of stroke.  If you are 48-61 years old, ask your health care provider if you should take aspirin to prevent a heart attack or a stroke.  Do not use any tobacco products, including cigarettes, chewing tobacco, or electronic cigarettes. If you need help quitting, ask your health care provider.  It is important to eat a healthy diet and maintain a healthy weight.  Be sure to include plenty of vegetables, fruits, low-fat dairy products, and lean protein.  Avoid eating foods that are high in solid fats, added sugars, or salt (sodium).  Get regular exercise. This is one of the most important things that you can do for your health.  Try to exercise for at least 150 minutes each week. The type of exercise that you do should increase your heart rate and make you sweat. This is known as moderate-intensity exercise.  Try to do strengthening exercises at least twice each week. Do these in addition to the moderate-intensity exercise.  Know your numbers.Ask your health care provider to check your cholesterol and your blood glucose. Continue to have your blood tested as directed by your health care provider. What should I know about cancer screening? There are several types of cancer. Take the following steps to reduce your risk and to catch any cancer development as early as possible. Breast Cancer  Practice breast self-awareness.  This means understanding how your breasts normally appear and feel.  It also means doing regular breast self-exams. Let your health care provider know about any changes, no matter how small.  If you are 40 or older,  have a clinician do a breast exam (clinical breast exam or CBE) every year. Depending on your age, family history, and medical history, it may be recommended that you also have a yearly breast X-ray (mammogram).  If you  have a family history of breast cancer, talk with your health care provider about genetic screening.  If you are at high risk for breast cancer, talk with your health care provider about having an MRI and a mammogram every year.  Breast cancer (BRCA) gene test is recommended for women who have family members with BRCA-related cancers. Results of the assessment will determine the need for genetic counseling and BRCA1 and for BRCA2 testing. BRCA-related cancers include these types:  Breast. This occurs in males or females.  Ovarian.  Tubal. This may also be called fallopian tube cancer.  Cancer of the abdominal or pelvic lining (peritoneal cancer).  Prostate.  Pancreatic. Cervical, Uterine, and Ovarian Cancer  Your health care provider may recommend that you be screened regularly for cancer of the pelvic organs. These include your ovaries, uterus, and vagina. This screening involves a pelvic exam, which includes checking for microscopic changes to the surface of your cervix (Pap test).  For women ages 21-65, health care providers may recommend a pelvic exam and a Pap test every three years. For women ages 23-65, they may recommend the Pap test and pelvic exam, combined with testing for human papilloma virus (HPV), every five years. Some types of HPV increase your risk of cervical cancer. Testing for HPV may also be done on women of any age who have unclear Pap test results.  Other health care providers may not recommend any screening for nonpregnant women who are considered low risk for pelvic cancer and have no symptoms. Ask your health care provider if a screening pelvic exam is right for you.  If you have had past treatment for cervical cancer or a condition that could lead to cancer, you need Pap tests and screening for cancer for at least 20 years after your treatment. If Pap tests have been discontinued for you, your risk factors (such as having a new sexual partner) need to be reassessed  to determine if you should start having screenings again. Some women have medical problems that increase the chance of getting cervical cancer. In these cases, your health care provider may recommend that you have screening and Pap tests more often.  If you have a family history of uterine cancer or ovarian cancer, talk with your health care provider about genetic screening.  If you have vaginal bleeding after reaching menopause, tell your health care provider.  There are currently no reliable tests available to screen for ovarian cancer. Lung Cancer  Lung cancer screening is recommended for adults 99-83 years old who are at high risk for lung cancer because of a history of smoking. A yearly low-dose CT scan of the lungs is recommended if you:  Currently smoke.  Have a history of at least 30 pack-years of smoking and you currently smoke or have quit within the past 15 years. A pack-year is smoking an average of one pack of cigarettes per day for one year. Yearly screening should:  Continue until it has been 15 years since you quit.  Stop if you develop a health problem that would prevent you from having lung cancer treatment. Colorectal Cancer  This type of cancer can be detected and can often be prevented.  Routine colorectal cancer screening usually begins at age 72 and continues  through age 75.  If you have risk factors for colon cancer, your health care provider may recommend that you be screened at an earlier age.  If you have a family history of colorectal cancer, talk with your health care provider about genetic screening.  Your health care provider may also recommend using home test kits to check for hidden blood in your stool.  A small camera at the end of a tube can be used to examine your colon directly (sigmoidoscopy or colonoscopy). This is done to check for the earliest forms of colorectal cancer.  Direct examination of the colon should be repeated every 5-10 years until  age 75. However, if early forms of precancerous polyps or small growths are found or if you have a family history or genetic risk for colorectal cancer, you may need to be screened more often. Skin Cancer  Check your skin from head to toe regularly.  Monitor any moles. Be sure to tell your health care provider:  About any new moles or changes in moles, especially if there is a change in a mole's shape or color.  If you have a mole that is larger than the size of a pencil eraser.  If any of your family members has a history of skin cancer, especially at a [place] age, talk with your health care provider about genetic screening.  Always use sunscreen. Apply sunscreen liberally and repeatedly throughout the day.  Whenever you are outside, protect yourself by wearing long sleeves, pants, a wide-brimmed hat, and sunglasses. What should I know about osteoporosis? Osteoporosis is a condition in which bone destruction happens more quickly than new bone creation. After menopause, you may be at an increased risk for osteoporosis. To help prevent osteoporosis or the bone fractures that can happen because of osteoporosis, the following is recommended:  If you are 19-50 years old, get at least 1,000 mg of calcium and at least 600 mg of vitamin D per day.  If you are older than age 50 but younger than age 70, get at least 1,200 mg of calcium and at least 600 mg of vitamin D per day.  If you are older than age 70, get at least 1,200 mg of calcium and at least 800 mg of vitamin D per day. Smoking and excessive alcohol intake increase the risk of osteoporosis. Eat foods that are rich in calcium and vitamin D, and do weight-bearing exercises several times each week as directed by your health care provider. What should I know about how menopause affects my mental health? Depression may occur at any age, but it is more common as you become older. Common symptoms of depression include:  Low or sad  mood.  Changes in sleep patterns.  Changes in appetite or eating patterns.  Feeling an overall lack of motivation or enjoyment of activities that you previously enjoyed.  Frequent crying spells. Talk with your health care provider if you think that you are experiencing depression. What should I know about immunizations? It is important that you get and maintain your immunizations. These include:  Tetanus, diphtheria, and pertussis (Tdap) booster vaccine.  Influenza every year before the flu season begins.  Pneumonia vaccine.  Shingles vaccine. Your health care provider may also recommend other immunizations. This information is not intended to replace advice given to you by your health care provider. Make sure you discuss any questions you have with your health care provider. Document Released: 01/17/2006 Document Revised: 06/14/2016 Document Reviewed: 08/29/2015 Elsevier Interactive Patient   Education  2017 [employer]  

## 2017-03-27 LAB — URINALYSIS W MICROSCOPIC + REFLEX CULTURE
Bacteria, UA: NONE SEEN [HPF]
Bilirubin Urine: NEGATIVE
Casts: NONE SEEN [LPF]
Crystals: NONE SEEN [HPF]
Glucose, UA: NEGATIVE
Hgb urine dipstick: NEGATIVE
Ketones, ur: NEGATIVE
Leukocytes, UA: NEGATIVE
Nitrite: NEGATIVE
Protein, ur: NEGATIVE
Specific Gravity, Urine: 1.021 (ref 1.001–1.035)
Squamous Epithelial / LPF: NONE SEEN [HPF] (ref <=5)
WBC, UA: NONE SEEN WBC/HPF (ref <=5)
Yeast: NONE SEEN [HPF]
pH: 5.5 (ref 5.0–8.0)

## 2017-03-28 LAB — URINE CULTURE: Organism ID, Bacteria: NO GROWTH

## 2017-04-23 ENCOUNTER — Encounter: Payer: Self-pay | Admitting: Gynecology

## 2017-06-10 ENCOUNTER — Encounter: Payer: Self-pay | Admitting: Women's Health

## 2017-06-10 ENCOUNTER — Ambulatory Visit (INDEPENDENT_AMBULATORY_CARE_PROVIDER_SITE_OTHER): Payer: 59 | Admitting: Women's Health

## 2017-06-10 VITALS — BP 126/80

## 2017-06-10 DIAGNOSIS — B009 Herpesviral infection, unspecified: Secondary | ICD-10-CM | POA: Insufficient documentation

## 2017-06-10 MED ORDER — ACYCLOVIR 200 MG PO CAPS: 200.0000 mg | ORAL_CAPSULE | Freq: Every day | ORAL | 12 refills | Status: DC

## 2017-06-10 NOTE — Patient Instructions (Signed)
Genital Herpes Genital herpes is a common sexually transmitted infection (STI) that is caused by a virus. The virus spreads from person to person through sexual contact. Infection can cause itching, blisters, and sores around the genitals or rectum. Symptoms may last several days and then go away This is called an outbreak. However, the virus remains in your body, so you may have more outbreaks in the future. The time between outbreaks varies and can be months or years. Genital herpes affects men and women. It is particularly concerning for pregnant women because the virus can be passed to the baby during delivery and can cause serious problems. Genital herpes is also a concern for people who have a weak disease-fighting (immune) system. What are the causes? This condition is caused by the herpes simplex virus (HSV) type 1 or type 2. The virus may spread through:  Sexual contact with an infected person, including vaginal, anal, and oral sex.  Contact with fluid from a herpes sore.  The skin. This means that you can get herpes from an infected partner even if he or she does not have a visible sore or does not know that he or she is infected. What increases the risk? You are more likely to develop this condition if:  You have sex with many partners.  You do not use latex condoms during sex. What are the signs or symptoms? Most people do not have symptoms (asymptomatic) or have mild symptoms that may be mistaken for other skin problems. Symptoms may include:  Small red bumps near the genitals, rectum, or mouth. These bumps turn into blisters and then turn into sores.  Flu-like symptoms, including:  Fever.  Body aches.  Swollen lymph nodes.  Headache.  Painful urination.  Pain and itching in the genital area or rectal area.  Vaginal discharge.  Tingling or shooting pain in the legs and buttocks. Generally, symptoms are more severe and last longer during the first (primary)  outbreak. Flu-like symptoms are also more common during the primary outbreak. How is this diagnosed? Genital herpes may be diagnosed based on:  A physical exam.  Your medical history.  Blood tests.  A test of a fluid sample (culture) from an open sore. How is this treated? There is no cure for this condition, but treatment with antiviral medicines that are taken by mouth (orally) can do the following:  Speed up healing and relieve symptoms.  Help to reduce the spread of the virus to sexual partners.  Limit the chance of future outbreaks, or make future outbreaks shorter.  Lessen symptoms of future outbreaks. Your health care provider may also recommend pain relief medicines, such as aspirin or ibuprofen. Follow these instructions at home: Sexual activity   Do not have sexual contact during active outbreaks.  Practice safe sex. Latex condoms and female condoms may help prevent the spread of the herpes virus. General instructions   Keep the affected areas dry and clean.  Take over-the-counter and prescription medicines only as told by your health care provider.  Avoid rubbing or touching blisters and sores. If you do touch blisters or sores:  Wash your hands thoroughly with soap and water.  Do not touch your eyes afterward.  To help relieve pain or itching, you may take the following actions as directed by your health care provider:  Apply a cold, wet cloth (cold compress) to affected areas 4-6 times a day.  Apply a substance that protects your skin and reduces bleeding (astringent).  Apply a   gel that helps relieve pain around sores (lidocaine gel).  Take a warm, shallow bath that cleans the genital area (sitz bath).  Keep all follow-up visits as told by your health care provider. This is important. How is this prevented?  Use condoms. Although anyone can get genital herpes during sexual contact, even with the use of a condom, a condom can provide some  protection.  Avoid having multiple sexual partners.  Talk with your sexual partner about any symptoms either of you may have. Also, talk with your partner about any history of STIs.  Get tested for STIs before you have sex. Ask your partner to do the same.  Do not have sexual contact if you have symptoms of genital herpes. Contact a health care provider if:  Your symptoms are not improving with medicine.  Your symptoms return.  You have new symptoms.  You have a fever.  You have abdominal pain.  You have redness, swelling, or pain in your eye.  You notice new sores on other parts of your body.  You are a woman and experience bleeding between menstrual periods.  You have had herpes and you become pregnant or plan to become pregnant. Summary  Genital herpes is a common sexually transmitted infection (STI) that is caused by the herpes simplex virus (HSV) type 1 or type 2.  These viruses are most often spread through sexual contact with an infected person.  You are more likely to develop this condition if you have sex with many partners or you have unprotected sex.  Most people do not have symptoms (asymptomatic) or have mild symptoms that may be mistaken for other skin problems. Symptoms occur as outbreaks that may happen months or years apart.  There is no cure for this condition, but treatment with oral antiviral medicines can reduce symptoms, reduce the chance of spreading the virus to a partner, prevent future outbreaks, or shorten future outbreaks. This information is not intended to replace advice given to you by your health care provider. Make sure you discuss any questions you have with your health care provider. Document Released: 11/22/2000 Document Revised: 10/25/2016 Document Reviewed: 10/25/2016 Elsevier Interactive Patient Education  2017 [employer]  

## 2017-06-10 NOTE — Progress Notes (Signed)
Presents to discuss recent diagnosis of HSV. Had a mildly painful bump on her perineum which has resolved. 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  Had not been sexually active for greater than one year. States has a new partner. He is having a full STD screen this week. Currently without insurance, requesting minimum but questions what to do for positive HSV. Denies vaginal discharge, abdominal pain, urinary symptoms or fever. TAH for fibroids with RSO on no HRT. Currently out of work,. History of a CVA,  hypertension and hypercholesterolemia,  primary care manages.  Exam: Appears well. Resolved HSV lesion on labia.  HSV 1 and 2  Plan: Options reviewed acyclovir 200 mg 5 times daily when necessary. Prescription, proper use given and reviewed. Reviewed HSV 1 and 2, questions answered. Encouraged condoms until permanent partner. Options for STD testing reviewed, reviewed cost or visiting health Department for full STD screen. Will go to the health department. Return to office for annual exam 03/2018 or when necessary.

## 2017-06-25 ENCOUNTER — Other Ambulatory Visit: Payer: 59

## 2017-06-25 ENCOUNTER — Other Ambulatory Visit: Payer: Self-pay | Admitting: Women's Health

## 2017-06-25 ENCOUNTER — Telehealth: Payer: Self-pay | Admitting: *Deleted

## 2017-06-25 DIAGNOSIS — Z20828 Contact with and (suspected) exposure to other viral communicable diseases: Secondary | ICD-10-CM

## 2017-06-25 NOTE — Telephone Encounter (Signed)
Okay for HSV 1/2 lab for confirmation. Have her schedule lab appointment. I will put order in computer.

## 2017-06-25 NOTE — Telephone Encounter (Signed)
Pt called to follow up with OV on 06/10/17 states her partner tested negative for HSV and all STD, pt asked if she could come back to office to have HSV 1/2 blood test done for confirm previous results from urgent care? Please advise

## 2017-06-25 NOTE — Telephone Encounter (Signed)
Pt informed, coming today at 2pm,

## 2017-06-26 LAB — HSV(HERPES SIMPLEX VRS) I + II AB-IGG
HSV 1 Glycoprotein G Ab, IgG: 47.6 Index — ABNORMAL HIGH (ref <0.90)
HSV 2 Glycoprotein G Ab, IgG: 9.97 Index — ABNORMAL HIGH (ref <0.90)

## 2017-11-19 ENCOUNTER — Encounter: Payer: Self-pay | Admitting: Women's Health

## 2017-11-19 ENCOUNTER — Ambulatory Visit: Payer: BLUE CROSS/BLUE SHIELD | Admitting: Women's Health

## 2017-11-19 VITALS — BP 130/82

## 2017-11-19 DIAGNOSIS — Z23 Encounter for immunization: Secondary | ICD-10-CM | POA: Diagnosis not present

## 2017-11-19 DIAGNOSIS — B3731 Acute candidiasis of vulva and vagina: Secondary | ICD-10-CM

## 2017-11-19 DIAGNOSIS — B373 Candidiasis of vulva and vagina: Secondary | ICD-10-CM

## 2017-11-19 LAB — WET PREP FOR TRICH, YEAST, CLUE

## 2017-11-19 MED ORDER — FLUCONAZOLE 150 MG PO TABS: 150.0000 mg | ORAL_TABLET | Freq: Once | ORAL | 1 refills | Status: AC

## 2017-11-19 NOTE — Progress Notes (Signed)
61 year old S SBF G1 P1 presents with complaint of yellow vaginal discharge with mild vaginal irritation for 4 days. Denies itching/odor, urinary symptoms, abdominal pain, fever.. Not sexually active, denies need for STD screen. 1999 TAH with RSO for fibroids. Medical problems include hypertension and hypercholesterolemia. HSV history rare outbreaks.  Exam: Appears well. External genitalia within normal limits, speculum exam moderate amount of a curdy white discharge noted, wet prep positive for yeast.  Yeast vaginitis  Plan: Diflucan 150 by mouth times one dose with refill if needed. She is prevention discussed. Will call if no relief of symptoms.

## 2017-11-19 NOTE — Patient Instructions (Signed)

## 2018-06-03 ENCOUNTER — Encounter: Payer: Self-pay | Admitting: Women's Health

## 2018-06-03 ENCOUNTER — Ambulatory Visit: Payer: BLUE CROSS/BLUE SHIELD | Admitting: Women's Health

## 2018-06-03 VITALS — BP 132/80

## 2018-06-03 DIAGNOSIS — B009 Herpesviral infection, unspecified: Secondary | ICD-10-CM | POA: Diagnosis not present

## 2018-06-03 MED ORDER — VALACYCLOVIR HCL 500 MG PO TABS: ORAL_TABLET | ORAL | 12 refills | Status: DC

## 2018-06-03 NOTE — Progress Notes (Signed)
62 y/o SBF G1P1 presents to discuss HSV diagnosis from positive HSV 1 IgG and HSV 2 IgG at an urgent care prior to 06/2017. Prescribed acyclovir 200 mg 5 times daily as needed for outbreaks. Denies ever having visible outbreaks or need to take acyclovir. Sexually active with sex partner of 1 year. Declines need for STD screening. Partner with negative STD since relationship began a year ago. Multiple concerns about transmitting HSV to new partner after reviewing related Internet material. Denies fevers, urinary symptoms, vaginal discharge, or abdominal pain. Requesting repeat HSV antibody testing and pelvic exam for reassurance. TAH for fibroids with RSO on no HRT. Works as Manufacturing engineercollege preparatory leader. History of CVA, hypertension, and hypercholesterolemia, managed by primary care.  Exam: Appears mildly anxious and worried.   External genitalia within normal limits with no visible lesions, bumps, erythema or discharge.  HSV 1 and 2  IgG positive/no outbreak history  Plan: Reassurance given regarding exam, requested retest blood work. HSV 1+2 IgG pending. Discussed misrepresentation of material reviewed on the Internet. Discussed risk of transmitting HSV to partner and option for suppressive therapy. Valacyclovir 500 mg daily #30 with refills. Proper use given. Condoms encouraged

## 2018-06-04 NOTE — Addendum Note (Signed)
Addended by: Rushie GoltzSPANGLER, Fraidy Mccarrick on: 06/04/2018 08:54 AM   Modules accepted: Orders

## 2018-06-08 ENCOUNTER — Telehealth: Payer: Self-pay

## 2018-06-08 MED ORDER — VALACYCLOVIR HCL 500 MG PO TABS: ORAL_TABLET | ORAL | 12 refills | Status: DC

## 2018-06-08 NOTE — Telephone Encounter (Signed)
Valtrex 500 mg  1 tablet daily #90 with 4 refills

## 2018-06-08 NOTE — Telephone Encounter (Signed)
Prescription resent with corrected instruction.

## 2018-06-08 NOTE — Telephone Encounter (Signed)
Pharmacy called and wants more specific instructions on the Valcyclovir. The directions read to take one tablet five times daily.  How many days to do this?, etc.

## 2018-06-09 ENCOUNTER — Other Ambulatory Visit: Payer: Self-pay | Admitting: Women's Health

## 2018-06-09 ENCOUNTER — Telehealth: Payer: Self-pay | Admitting: *Deleted

## 2018-06-09 DIAGNOSIS — B009 Herpesviral infection, unspecified: Secondary | ICD-10-CM

## 2018-06-09 NOTE — Telephone Encounter (Signed)
Patient was seen on 06/03/18 states labs were to be placed to recheck blood test for HSV, no orders in epic. Please advise

## 2018-06-09 NOTE — Telephone Encounter (Signed)
I think what you had and was correct but I put a different one in.  So she does want to get it checked?

## 2018-06-09 NOTE — Telephone Encounter (Signed)
Okay, if she would like she can come back in for a lab appointment and have that test done.  It is positive so will stay positive.

## 2018-06-09 NOTE — Telephone Encounter (Signed)
No she did not, she left, forgot to stop by lab.

## 2018-06-09 NOTE — Telephone Encounter (Signed)
Yes we were going to recheck HSV blood test I do not see that in the chart but she does have history of positive but wanted a recheck.  Ask her if she had blood drawn that day?

## 2018-06-09 NOTE — Telephone Encounter (Signed)
Molli KnockOkay will you check pending orders to confirm they are correct?

## 2018-06-10 ENCOUNTER — Other Ambulatory Visit: Payer: BLUE CROSS/BLUE SHIELD

## 2018-06-10 DIAGNOSIS — B009 Herpesviral infection, unspecified: Secondary | ICD-10-CM

## 2018-06-10 NOTE — Telephone Encounter (Signed)
Lab appointment scheduled 

## 2018-06-12 LAB — HSV(HERPES SIMPLEX VRS) I + II AB-IGG
HAV 1 IGG,TYPE SPECIFIC AB: 50.9 index — ABNORMAL HIGH
HSV 2 IGG,TYPE SPECIFIC AB: 6.75 index — ABNORMAL HIGH

## 2018-08-04 ENCOUNTER — Encounter: Payer: BLUE CROSS/BLUE SHIELD | Admitting: Women's Health

## 2018-09-29 ENCOUNTER — Ambulatory Visit (INDEPENDENT_AMBULATORY_CARE_PROVIDER_SITE_OTHER): Payer: BLUE CROSS/BLUE SHIELD | Admitting: Women's Health

## 2018-09-29 ENCOUNTER — Encounter: Payer: Self-pay | Admitting: Women's Health

## 2018-09-29 VITALS — BP 118/78 | Ht 60.0 in | Wt 159.0 lb

## 2018-09-29 DIAGNOSIS — Z01419 Encounter for gynecological examination (general) (routine) without abnormal findings: Secondary | ICD-10-CM | POA: Diagnosis not present

## 2018-09-29 DIAGNOSIS — Z23 Encounter for immunization: Secondary | ICD-10-CM

## 2018-09-29 MED ORDER — VALACYCLOVIR HCL 500 MG PO TABS: ORAL_TABLET | ORAL | 4 refills | Status: AC

## 2018-09-29 MED ORDER — ACYCLOVIR 200 MG PO CAPS: 200.0000 mg | ORAL_CAPSULE | Freq: Every day | ORAL | 12 refills | Status: AC

## 2018-09-29 NOTE — Progress Notes (Signed)
Melissa Duran 1956-08-26 [id number]    History:    Presents for annual exam.  1999 TAH with RSO for fibroids.  Normal Pap and mammogram history.  Not sexually active denies need for STD screen.  History of positive HSV on suppression therapy.  2015 T score -2 FRAX 4.8% / 0.3%.  Had a negative colonoscopy at age 62.  Primary care manages hypertension and hypercholesteremia.  Past medical history, past surgical history, family history and social history were all reviewed and documented in the EPIC chart.  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  Niece lives with her and helps care for her great nephew.  Mother hypertension.  ROS:  A ROS was performed and pertinent positives and negatives are included.  Exam:  Vitals:   09/29/18 1631  BP: 118/78  Weight: 159 lb (72.1 kg)  Height: 5' (1.524 m)   Body mass index is 31.05 kg/m.   General appearance:  Normal Thyroid:  Symmetrical, normal in size, without palpable masses or nodularity. Respiratory  Auscultation:  Clear without wheezing or rhonchi Cardiovascular  Auscultation:  Regular rate, without rubs, murmurs or gallops  Edema/varicosities:  Not grossly evident Abdominal  Soft,nontender, without masses, guarding or rebound.  Liver/spleen:  No organomegaly noted  Hernia:  None appreciated  Skin  Inspection:  Grossly normal   Breasts: Examined lying and sitting.     Right: Without masses, retractions, discharge or axillary adenopathy.     Left: Without masses, retractions, discharge or axillary adenopathy. Gentitourinary   Inguinal/mons:  Normal without inguinal adenopathy  External genitalia:  Normal  BUS/Urethra/Skene's glands:  Normal  Vagina:  Normal  Cervix: And uterus absent adnexa/parametria:     Rt: Without masses or tenderness.   Lt: Without masses or tenderness.  Anus and perineum: Normal  Digital rectal exam: Normal sphincter tone without palpated masses or tenderness  Assessment/Plan:  62 y.o. DBF G1, P1  for annual exam with no complaints.  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  Repeat DEXA, home safety, fall prevention and importance of weightbearing and balance type exercise reviewed and encouraged.  SBE's, overdue for annual screening mammogram, reviewed importance of annual screening calcium rich foods, vitamin D 2000 daily encouraged..  Valtrex 500 mg p.o. daily reviewed can stop and take twice daily for 3 to 5 days as needed.  Screening colonoscopy due instructed to schedule.  [facility] information given cannot remember where the last colonoscopy performed.  Harrington Challenger [facility], 4:54 PM 09/29/2018

## 2018-09-29 NOTE — Patient Instructions (Addendum)
[facility] Carlean Purl  [phone]  shingrex vaccine  Shingles vaccine  Get at [facility] Menopause is a normal process in which your reproductive ability comes to an end. This process happens gradually over a span of months to years, usually between the ages of 10 and 33. Menopause is complete when you have missed 12 consecutive menstrual periods. It is important to talk with your health care provider about some of the most common conditions that affect postmenopausal women, such as heart disease, cancer, and bone loss (osteoporosis). Adopting a healthy lifestyle and getting preventive care can help to promote your health and wellness. Those actions can also lower your chances of developing some of these common conditions. What should I know about menopause? During menopause, you may experience a number of symptoms, such as:  Moderate-to-severe hot flashes.  Night sweats.  Decrease in sex drive.  Mood swings.  Headaches.  Tiredness.  Irritability.  Memory problems.  Insomnia.  Choosing to treat or not to treat menopausal changes is an individual decision that you make with your health care provider. What should I know about hormone replacement therapy and supplements? Hormone therapy products are effective for treating symptoms that are associated with menopause, such as hot flashes and night sweats. Hormone replacement carries certain risks, especially as you become older. If you are thinking about using estrogen or estrogen with progestin treatments, discuss the benefits and risks with your health care provider. What should I know about heart disease and stroke? Heart disease, heart attack, and stroke become more likely as you age. This may be due, in part, to the hormonal changes that your body experiences during menopause. These can affect how your body processes dietary fats, triglycerides, and cholesterol. Heart attack and stroke are  both medical emergencies. There are many things that you can do to help prevent heart disease and stroke:  Have your blood pressure checked at least every 1-2 years. High blood pressure causes heart disease and increases the risk of stroke.  If you are 64-42 years old, ask your health care provider if you should take aspirin to prevent a heart attack or a stroke.  Do not use any tobacco products, including cigarettes, chewing tobacco, or electronic cigarettes. If you need help quitting, ask your health care provider.  It is important to eat a healthy diet and maintain a healthy weight. ? Be sure to include plenty of vegetables, fruits, low-fat dairy products, and lean protein. ? Avoid eating foods that are high in solid fats, added sugars, or salt (sodium).  Get regular exercise. This is one of the most important things that you can do for your health. ? Try to exercise for at least 150 minutes each week. The type of exercise that you do should increase your heart rate and make you sweat. This is known as moderate-intensity exercise. ? Try to do strengthening exercises at least twice each week. Do these in addition to the moderate-intensity exercise.  Know your numbers.Ask your health care provider to check your cholesterol and your blood glucose. Continue to have your blood tested as directed by your health care provider.  What should I know about cancer screening? There are several types of cancer. Take the following steps to reduce your risk and to catch any cancer development as early as possible. Breast Cancer  Practice breast self-awareness. ? This means understanding how your breasts normally appear and feel. ? It also means doing regular breast self-exams. Let  your health care provider know about any changes, no matter how small.  If you are 71 or older, have a clinician do a breast exam (clinical breast exam or CBE) every year. Depending on your age, family history, and medical  history, it may be recommended that you also have a yearly breast X-ray (mammogram).  If you have a family history of breast cancer, talk with your health care provider about genetic screening.  If you are at high risk for breast cancer, talk with your health care provider about having an MRI and a mammogram every year.  Breast cancer (BRCA) gene test is recommended for women who have family members with BRCA-related cancers. Results of the assessment will determine the need for genetic counseling and BRCA1 and for BRCA2 testing. BRCA-related cancers include these types: ? Breast. This occurs in males or females. ? Ovarian. ? Tubal. This may also be called fallopian tube cancer. ? Cancer of the abdominal or pelvic lining (peritoneal cancer). ? Prostate. ? Pancreatic.  Cervical, Uterine, and Ovarian Cancer Your health care provider may recommend that you be screened regularly for cancer of the pelvic organs. These include your ovaries, uterus, and vagina. This screening involves a pelvic exam, which includes checking for microscopic changes to the surface of your cervix (Pap test).  For women ages 21-65, health care providers may recommend a pelvic exam and a Pap test every three years. For women ages 80-65, they may recommend the Pap test and pelvic exam, combined with testing for human papilloma virus (HPV), every five years. Some types of HPV increase your risk of cervical cancer. Testing for HPV may also be done on women of any age who have unclear Pap test results.  Other health care providers may not recommend any screening for nonpregnant women who are considered low risk for pelvic cancer and have no symptoms. Ask your health care provider if a screening pelvic exam is right for you.  If you have had past treatment for cervical cancer or a condition that could lead to cancer, you need Pap tests and screening for cancer for at least 20 years after your treatment. If Pap tests have been  discontinued for you, your risk factors (such as having a new sexual partner) need to be reassessed to determine if you should start having screenings again. Some women have medical problems that increase the chance of getting cervical cancer. In these cases, your health care provider may recommend that you have screening and Pap tests more often.  If you have a family history of uterine cancer or ovarian cancer, talk with your health care provider about genetic screening.  If you have vaginal bleeding after reaching menopause, tell your health care provider.  There are currently no reliable tests available to screen for ovarian cancer.  Lung Cancer Lung cancer screening is recommended for adults 43-39 years old who are at high risk for lung cancer because of a history of smoking. A yearly low-dose CT scan of the lungs is recommended if you:  Currently smoke.  Have a history of at least 30 pack-years of smoking and you currently smoke or have quit within the past 15 years. A pack-year is smoking an average of one pack of cigarettes per day for one year.  Yearly screening should:  Continue until it has been 15 years since you quit.  Stop if you develop a health problem that would prevent you from having lung cancer treatment.  Colorectal Cancer  This type of  cancer can be detected and can often be prevented.  Routine colorectal cancer screening usually begins at age 55 and continues through age 102.  If you have risk factors for colon cancer, your health care provider may recommend that you be screened at an earlier age.  If you have a family history of colorectal cancer, talk with your health care provider about genetic screening.  Your health care provider may also recommend using home test kits to check for hidden blood in your stool.  A small camera at the end of a tube can be used to examine your colon directly (sigmoidoscopy or colonoscopy). This is done to check for the earliest  forms of colorectal cancer.  Direct examination of the colon should be repeated every 5-10 years until age 74. However, if early forms of precancerous polyps or small growths are found or if you have a family history or genetic risk for colorectal cancer, you may need to be screened more often.  Skin Cancer  Check your skin from head to toe regularly.  Monitor any moles. Be sure to tell your health care provider: ? About any new moles or changes in moles, especially if there is a change in a mole's shape or color. ? If you have a mole that is larger than the size of a pencil eraser.  If any of your family members has a history of skin cancer, especially at a young age, talk with your health care provider about genetic screening.  Always use sunscreen. Apply sunscreen liberally and repeatedly throughout the day.  Whenever you are outside, protect yourself by wearing long sleeves, pants, a wide-brimmed hat, and sunglasses.  What should I know about osteoporosis? Osteoporosis is a condition in which bone destruction happens more quickly than new bone creation. After menopause, you may be at an increased risk for osteoporosis. To help prevent osteoporosis or the bone fractures that can happen because of osteoporosis, the following is recommended:  If you are 61-22 years old, get at least 1,000 mg of calcium and at least 600 mg of vitamin D per day.  If you are older than age 63 but younger than age 66, get at least 1,200 mg of calcium and at least 600 mg of vitamin D per day.  If you are older than age 20, get at least 1,200 mg of calcium and at least 800 mg of vitamin D per day.  Smoking and excessive alcohol intake increase the risk of osteoporosis. Eat foods that are rich in calcium and vitamin D, and do weight-bearing exercises several times each week as directed by your health care provider. What should I know about how menopause affects my mental health? Depression may occur at any  age, but it is more common as you become older. Common symptoms of depression include:  Low or sad mood.  Changes in sleep patterns.  Changes in appetite or eating patterns.  Feeling an overall lack of motivation or enjoyment of activities that you previously enjoyed.  Frequent crying spells.  Talk with your health care provider if you think that you are experiencing depression. What should I know about immunizations? It is important that you get and maintain your immunizations. These include:  Tetanus, diphtheria, and pertussis (Tdap) booster vaccine.  Influenza every year before the flu season begins.  Pneumonia vaccine.  Shingles vaccine.  Your health care provider may also recommend other immunizations. This information is not intended to replace advice given to you by your health care  provider. Make sure you discuss any questions you have with your health care provider. Document Released: 01/17/2006 Document Revised: 06/14/2016 Document Reviewed: 08/29/2015 Elsevier Interactive Patient Education  2018 [employer].

## 2018-10-02 ENCOUNTER — Telehealth: Payer: Self-pay

## 2018-10-02 NOTE — Telephone Encounter (Signed)
Per Maryelizabeth Rowan NP note on 09/29/2018 (annual exam): It is time for a repeat DEXA scan. Message left on patient voice mail.

## 2018-12-15 ENCOUNTER — Other Ambulatory Visit: Payer: Self-pay | Admitting: *Deleted

## 2018-12-15 DIAGNOSIS — M8580 Other specified disorders of bone density and structure, unspecified site: Secondary | ICD-10-CM

## 2019-01-28 ENCOUNTER — Other Ambulatory Visit: Payer: Self-pay | Admitting: Women's Health

## 2019-01-28 ENCOUNTER — Ambulatory Visit (INDEPENDENT_AMBULATORY_CARE_PROVIDER_SITE_OTHER): Payer: BLUE CROSS/BLUE SHIELD

## 2019-01-28 DIAGNOSIS — Z780 Asymptomatic menopausal state: Secondary | ICD-10-CM

## 2019-01-28 DIAGNOSIS — M8589 Other specified disorders of bone density and structure, multiple sites: Secondary | ICD-10-CM | POA: Diagnosis not present

## 2019-01-28 DIAGNOSIS — M8580 Other specified disorders of bone density and structure, unspecified site: Secondary | ICD-10-CM

## 2019-10-04 ENCOUNTER — Other Ambulatory Visit: Payer: Self-pay

## 2019-10-05 ENCOUNTER — Encounter: Payer: Self-pay | Admitting: Women's Health

## 2019-10-05 ENCOUNTER — Ambulatory Visit (INDEPENDENT_AMBULATORY_CARE_PROVIDER_SITE_OTHER): Payer: Managed Care, Other (non HMO) | Admitting: Women's Health

## 2019-10-05 VITALS — BP 120/78 | Ht 60.0 in | Wt 165.0 lb

## 2019-10-05 DIAGNOSIS — Z23 Encounter for immunization: Secondary | ICD-10-CM

## 2019-10-05 DIAGNOSIS — Z01419 Encounter for gynecological examination (general) (routine) without abnormal findings: Secondary | ICD-10-CM

## 2019-10-05 NOTE — Progress Notes (Signed)
Melissa Duran June 08, 1956 [id number]    History:    Presents for annual exam.  1999 TAH with RSO for fibroids on no HRT.  Normal Pap and mammogram history overdue for mammogram.  2020 T score -1.9 FRAX 9.3% / 0.1% DEXA showed stability.  Primary care manages hypertension, hypercholesteremia,.  HSV no outbreaks.  Age 63 - polyp on colonoscopy 10-year follow-up, has not had/cost- not covered with insurance had a negative Cologuard at [facility].    Past medical history, past surgical history, family history and social history were all reviewed and documented in the EPIC chart.  Physicist, medical for school.  Mother hypertension has had a stroke.  Son doing well.  History of an ankle fracture with traumatic fall  ROS:  A ROS was performed and pertinent positives and negatives are included.  Exam:  Vitals:   10/05/19 1531  BP: 120/78  Weight: 165 lb (74.8 kg)  Height: 5' (1.524 m)   Body mass index is 32.22 kg/m.   General appearance:  Normal Thyroid:  Symmetrical, normal in size, without palpable masses or nodularity. Respiratory  Auscultation:  Clear without wheezing or rhonchi Cardiovascular  Auscultation:  Regular rate, without rubs, murmurs or gallops  Edema/varicosities:  Not grossly evident Abdominal  Soft,nontender, without masses, guarding or rebound.  Liver/spleen:  No organomegaly noted  Hernia:  None appreciated  Skin  Inspection:  Grossly normal   Breasts: Examined lying and sitting.     Right: Without masses, retractions, discharge or axillary adenopathy.     Left: Without masses, retractions, discharge or axillary adenopathy. Gentitourinary   Inguinal/mons:  Normal without inguinal adenopathy  External genitalia:  Normal  BUS/Urethra/Skene's glands:  Normal  Vagina:  Normal  Cervix: And uterus absent  Adnexa/parametria:     Rt: Without masses or tenderness.   Lt: Without masses or tenderness.  Anus and perineum: Normal  Digital rectal exam: Normal sphincter tone  without palpated masses or tenderness  Assessment/Plan:  63 y.o. S BF G1, P1 for annual exam with no complaints.  1999 TAH with RSO for fibroids on no HRT Osteopenia without elevated FRAX Hypertension, hypercholesteremia-primary care manages labs and meds HSV no outbreaks.  Plan: SBEs, overdue for mammogram instructed to schedule has had at [facility].  Reviewed importance of increasing regular cardio and balance type exercise, home safety, fall prevention discussed.  Vitamin D 2000 daily encouraged.  Currently not sexually active, condoms with vaginal lubricant encouraged - mild vaginal dryness..  Shingrex encouraged.    [place], 3:52 PM 10/05/2019

## 2019-10-05 NOTE — Patient Instructions (Signed)

## 2019-12-14 ENCOUNTER — Ambulatory Visit: Payer: No Typology Code available for payment source | Attending: Internal Medicine

## 2019-12-14 DIAGNOSIS — Z20822 Contact with and (suspected) exposure to covid-19: Secondary | ICD-10-CM

## 2019-12-16 LAB — NOVEL CORONAVIRUS, NAA: SARS-CoV-2, NAA: NOT DETECTED

## 2020-04-20 ENCOUNTER — Ambulatory Visit
Admission: RE | Admit: 2020-04-20 | Discharge: 2020-04-20 | Disposition: A | Discharge status: Home / Self Care | Payer: 59 | Source: Ambulatory Visit | Attending: Family Medicine | Admitting: Family Medicine

## 2020-04-20 ENCOUNTER — Other Ambulatory Visit: Payer: Self-pay | Admitting: Family Medicine

## 2020-04-20 DIAGNOSIS — M25562 Pain in left knee: Secondary | ICD-10-CM

## 2020-04-20 DIAGNOSIS — M25561 Pain in right knee: Secondary | ICD-10-CM

## 2020-10-09 ENCOUNTER — Ambulatory Visit (INDEPENDENT_AMBULATORY_CARE_PROVIDER_SITE_OTHER): Payer: 59 | Admitting: Nurse Practitioner

## 2020-10-09 ENCOUNTER — Encounter: Payer: Self-pay | Admitting: Nurse Practitioner

## 2020-10-09 ENCOUNTER — Other Ambulatory Visit: Payer: Self-pay

## 2020-10-09 VITALS — BP 140/86 | Ht 60.0 in | Wt 164.0 lb

## 2020-10-09 DIAGNOSIS — Z90710 Acquired absence of both cervix and uterus: Secondary | ICD-10-CM | POA: Diagnosis not present

## 2020-10-09 DIAGNOSIS — Z9079 Acquired absence of other genital organ(s): Secondary | ICD-10-CM

## 2020-10-09 DIAGNOSIS — M8589 Other specified disorders of bone density and structure, multiple sites: Secondary | ICD-10-CM

## 2020-10-09 DIAGNOSIS — Z01419 Encounter for gynecological examination (general) (routine) without abnormal findings: Secondary | ICD-10-CM

## 2020-10-09 DIAGNOSIS — Z23 Encounter for immunization: Secondary | ICD-10-CM | POA: Diagnosis not present

## 2020-10-09 NOTE — Progress Notes (Signed)
   Melissa Duran 01-28-1956 [id number]   History:  64 y.o. G1P1 presents for annual exam without GYN complaints. 1999 TSH RSO for fibroids, no HRT. Osteopenia. Normal pap and mammogram history.   Gynecologic History Patient's last menstrual period was 03/14/1998.   Last Pap: 08/08/2011. Results were: normal Last mammogram: 2015. Results were: normal Last colonoscopy: 12 years ago. Results were: polyp, 10-year repeat recommended Last Dexa: 01/2019. Results were: t-score -1.9, FRAX 9.3% / 0.1%  Past medical history, past surgical history, family history and social history were all reviewed and documented in the EPIC chart.  ROS:  A ROS was performed and pertinent positives and negatives are included.  Exam:  Vitals:   10/09/20 1553  BP: 140/86  Weight: 164 lb (74.4 kg)  Height: 5' (1.524 m)   Body mass index is 32.03 kg/m.  General appearance:  Normal Thyroid:  Symmetrical, normal in size, without palpable masses or nodularity. Respiratory  Auscultation:  Clear without wheezing or rhonchi Cardiovascular  Auscultation:  Regular rate, without rubs, murmurs or gallops  Edema/varicosities:  Not grossly evident Abdominal  Soft,nontender, without masses, guarding or rebound.  Liver/spleen:  No organomegaly noted  Hernia:  None appreciated  Skin  Inspection:  Grossly normal   Breasts: Examined lying and sitting.   Right: Without masses, retractions, discharge or axillary adenopathy.   Left: Without masses, retractions, discharge or axillary adenopathy. Gentitourinary   Inguinal/mons:  Normal without inguinal adenopathy  External genitalia:  Normal  BUS/Urethra/Skene's glands:  Normal  Vagina:  Normal  Cervix:  Absent  Uterus:  Absent  Adnexa/parametria:     Rt: Without masses or tenderness.   Lt: Without masses or tenderness.  Anus and perineum: Normal  Digital rectal exam: Declines  Assessment/Plan:  64 y.o. G1P1 for annual exam.   Well female exam with routine  gynecological exam - Education provided on SBEs, importance of preventative screenings, current guidelines, high calcium diet, regular exercise, and multivitamin daily. Labs with PCP.   Osteopenia of multiple sites - Recommend daily Vitamin D supplement and regular exercise incorporating weightbearing exercises.   History of total abdominal hysterectomy - 1999 for fibroids  Hx of unilateral salpingectomy - Right  Screening for cervical cancer -normal Pap history.  Will repeat Pap next year at 10-year interval.   Screening for breast cancer -normal mammogram history.  Overdue for screening mammogram.  Information provided on the breast center.  Discussed current guidelines and importance of preventative screenings.  Normal breast exam today.  Screening for colon cancer -overdue for screening colonoscopy.  Information provided on the bowel or GI.  Follow-up in 1 year for annual.     Olivia Mackie [facility], 4:04 PM 10/09/2020

## 2020-10-09 NOTE — Patient Instructions (Addendum)
Schedule mammogram! [facility] [phone] [address]  [place], [place] [address]  Schedule colonoscopy! [facility] [phone] [address] [place], [place] [address]  Vitamin D 1200 IUs daily   Health Maintenance for Postmenopausal Women Menopause is a normal process in which your ability to get pregnant comes to an end. This process happens slowly over many months or years, usually between the ages of 67 and 42. Menopause is complete when you have missed your menstrual periods for 12 months. It is important to talk with your health care provider about some of the most common conditions that affect women after menopause (postmenopausal women). These include heart disease, cancer, and bone loss (osteoporosis). Adopting a healthy lifestyle and getting preventive care can help to promote your health and wellness. The actions you take can also lower your chances of developing some of these common conditions. What should I know about menopause? During menopause, you may get a number of symptoms, such as:  Hot flashes. These can be moderate or severe.  Night sweats.  Decrease in sex drive.  Mood swings.  Headaches.  Tiredness.  Irritability.  Memory problems.  Insomnia. Choosing to treat or not to treat these symptoms is a decision that you make with your health care provider. Do I need hormone replacement therapy?  Hormone replacement therapy is effective in treating symptoms that are caused by menopause, such as hot flashes and night sweats.  Hormone replacement carries certain risks, especially as you become older. If you are thinking about using estrogen or estrogen with progestin, discuss the benefits and risks with your health care provider. What is my risk for heart disease and stroke? The risk of heart disease, heart attack, and stroke increases as you age. One of the causes may be a change in the body's hormones during menopause.  This can affect how your body uses dietary fats, triglycerides, and cholesterol. Heart attack and stroke are medical emergencies. There are many things that you can do to help prevent heart disease and stroke. Watch your blood pressure  High blood pressure causes heart disease and increases the risk of stroke. This is more likely to develop in people who have high blood pressure readings, are of African descent, or are overweight.  Have your blood pressure checked: ? Every 3-5 years if you are 20-56 years of age. ? Every year if you are 25 years old or older. Eat a healthy diet   Eat a diet that includes plenty of vegetables, fruits, low-fat dairy products, and lean protein.  Do not eat a lot of foods that are high in solid fats, added sugars, or sodium. Get regular exercise Get regular exercise. This is one of the most important things you can do for your health. Most adults should:  Try to exercise for at least 150 minutes each week. The exercise should increase your heart rate and make you sweat (moderate-intensity exercise).  Try to do strengthening exercises at least twice each week. Do these in addition to the moderate-intensity exercise.  Spend less time sitting. Even light physical activity can be beneficial. Other tips  Work with your health care provider to achieve or maintain a healthy weight.  Do not use any products that contain nicotine or tobacco, such as cigarettes, e-cigarettes, and chewing tobacco. If you need help quitting, ask your health care provider.  Know your numbers. Ask your health care provider to check your cholesterol and your blood sugar (glucose). Continue  to have your blood tested as directed by your health care provider. Do I need screening for cancer? Depending on your health history and family history, you may need to have cancer screening at different stages of your life. This may include screening for:  Breast cancer.  Cervical cancer.  Lung  cancer.  Colorectal cancer. What is my risk for osteoporosis? After menopause, you may be at increased risk for osteoporosis. Osteoporosis is a condition in which bone destruction happens more quickly than new bone creation. To help prevent osteoporosis or the bone fractures that can happen because of osteoporosis, you may take the following actions:  If you are 45-57 years old, get at least 1,000 mg of calcium and at least 600 mg of vitamin D per day.  If you are older than age 57 but younger than age 80, get at least 1,200 mg of calcium and at least 600 mg of vitamin D per day.  If you are older than age 33, get at least 1,200 mg of calcium and at least 800 mg of vitamin D per day. Smoking and drinking excessive alcohol increase the risk of osteoporosis. Eat foods that are rich in calcium and vitamin D, and do weight-bearing exercises several times each week as directed by your health care provider. How does menopause affect my mental health? Depression may occur at any age, but it is more common as you become older. Common symptoms of depression include:  Low or sad mood.  Changes in sleep patterns.  Changes in appetite or eating patterns.  Feeling an overall lack of motivation or enjoyment of activities that you previously enjoyed.  Frequent crying spells. Talk with your health care provider if you think that you are experiencing depression. General instructions See your health care provider for regular wellness exams and vaccines. This may include:  Scheduling regular health, dental, and eye exams.  Getting and maintaining your vaccines. These include: ? Influenza vaccine. Get this vaccine each year before the flu season begins. ? Pneumonia vaccine. ? Shingles vaccine. ? Tetanus, diphtheria, and pertussis (Tdap) booster vaccine. Your health care provider may also recommend other immunizations. Tell your health care provider if you have ever been abused or do not feel safe at  home. Summary  Menopause is a normal process in which your ability to get pregnant comes to an end.  This condition causes hot flashes, night sweats, decreased interest in sex, mood swings, headaches, or lack of sleep.  Treatment for this condition may include hormone replacement therapy.  Take actions to keep yourself healthy, including exercising regularly, eating a healthy diet, watching your weight, and checking your blood pressure and blood sugar levels.  Get screened for cancer and depression. Make sure that you are up to date with all your vaccines. This information is not intended to replace advice given to you by your health care provider. Make sure you discuss any questions you have with your health care provider. Document Revised: 11/18/2018 Document Reviewed: 11/18/2018 Elsevier Patient Education  2020 [employer].

## 2020-11-27 ENCOUNTER — Ambulatory Visit: Payer: 59 | Attending: Internal Medicine

## 2020-11-27 DIAGNOSIS — Z23 Encounter for immunization: Secondary | ICD-10-CM

## 2020-11-27 NOTE — Progress Notes (Signed)
   Covid-[facility] Vaccination [facility]  Name:  ANELY SPIEWAK    MRN: [id number] DOB: 10-30-1956  11/27/2020  Ms. Andres was observed post Covid-19 immunization for 15 minutes without incident. She was provided with Vaccine Information Sheet and instruction to access the V-Safe system.   Ms. Cahue was instructed to call 911 with any severe reactions post vaccine: Marland Kitchen Difficulty breathing  . Swelling of face and throat  . A fast heartbeat  . A bad rash all over body  . Dizziness and weakness   Immunizations Administered    Name Date Dose VIS Date Route   Pfizer COVID-19 Vaccine 11/27/2020  2:17 PM 0.3 mL 09/27/2020 Intramuscular   Manufacturer: [employer], [employer]   Lot: [id number]   NDC: [id number]

## 2020-11-29 ENCOUNTER — Other Ambulatory Visit (HOSPITAL_BASED_OUTPATIENT_CLINIC_OR_DEPARTMENT_OTHER): Payer: Self-pay | Admitting: Internal Medicine

## 2020-11-29 MED FILL — PFIZER-BIONTECH COVID-19 VA: 30 | 21 days supply | Qty: 0 | Fill #0

## 2021-10-30 IMAGING — CR DG KNEE COMPLETE 4+V*L*
4 series · 4 of 4 positions shown · non-contrast
Comparison: December 22, 2015

CLINICAL DATA: Pain

EXAM:
LEFT KNEE - COMPLETE 4+ VIEW

[t knee ap left]
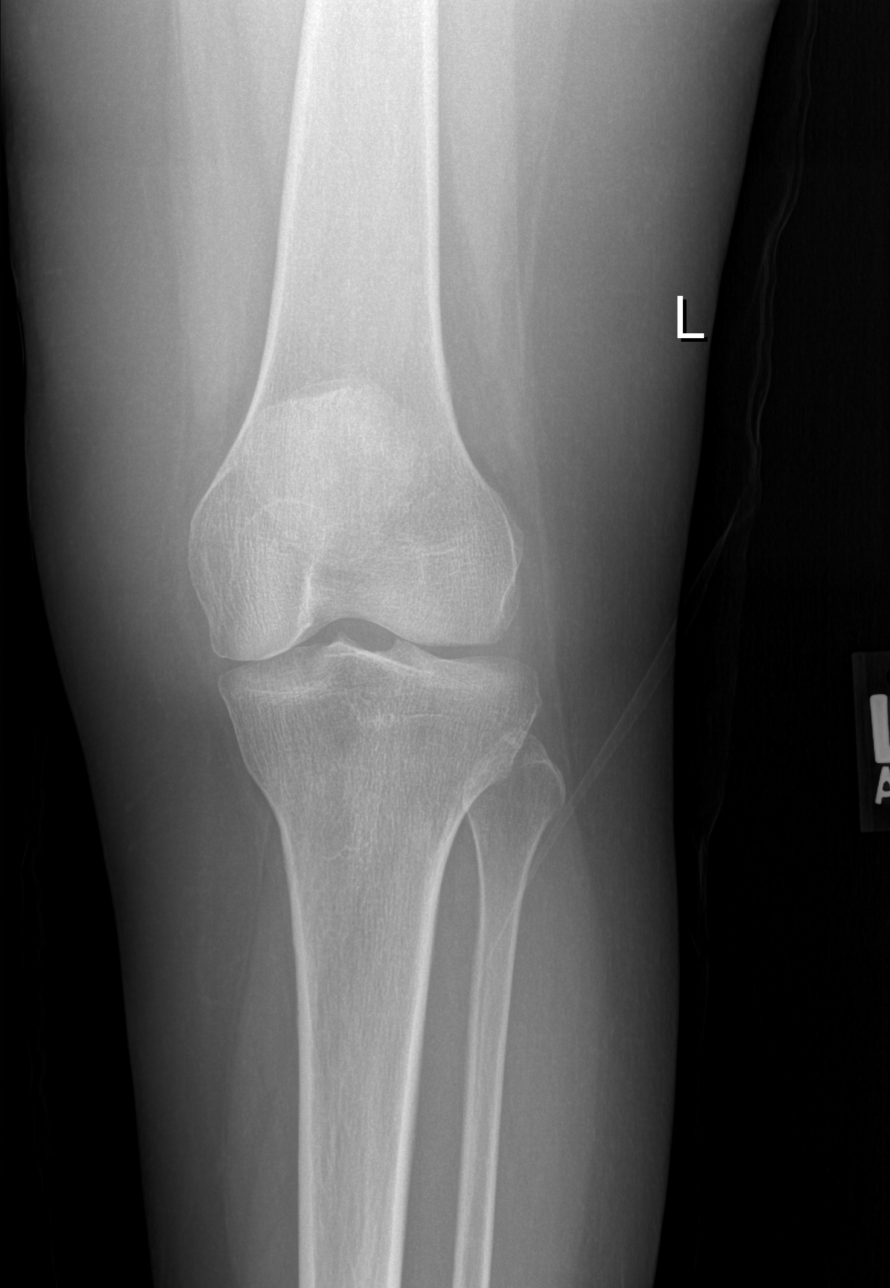

[t knee oblique left (1 of 2)]
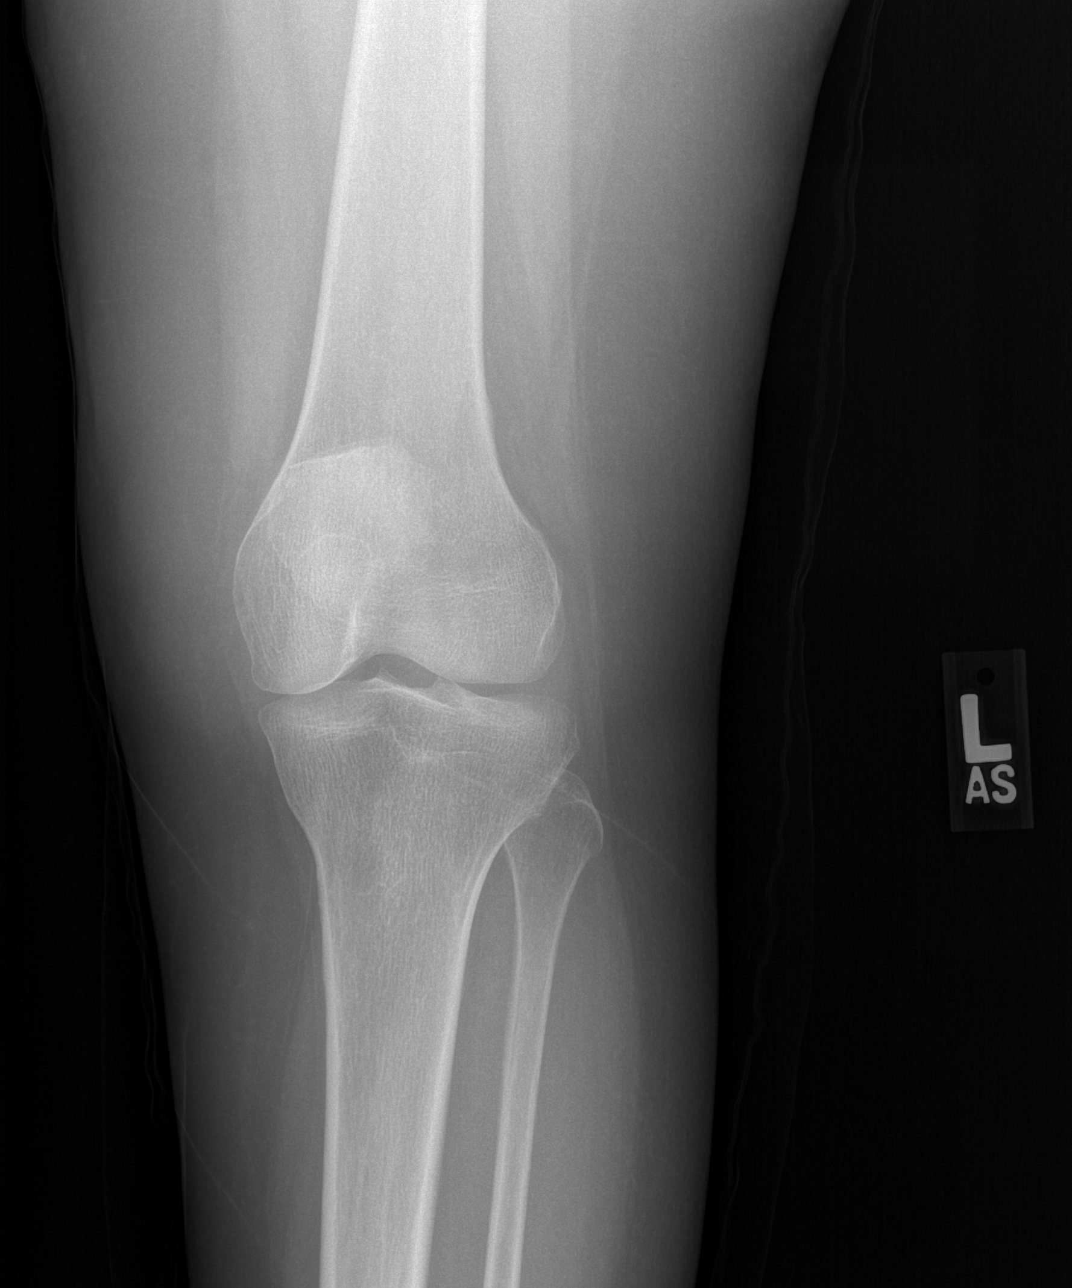

[t knee oblique left (2 of 2)]
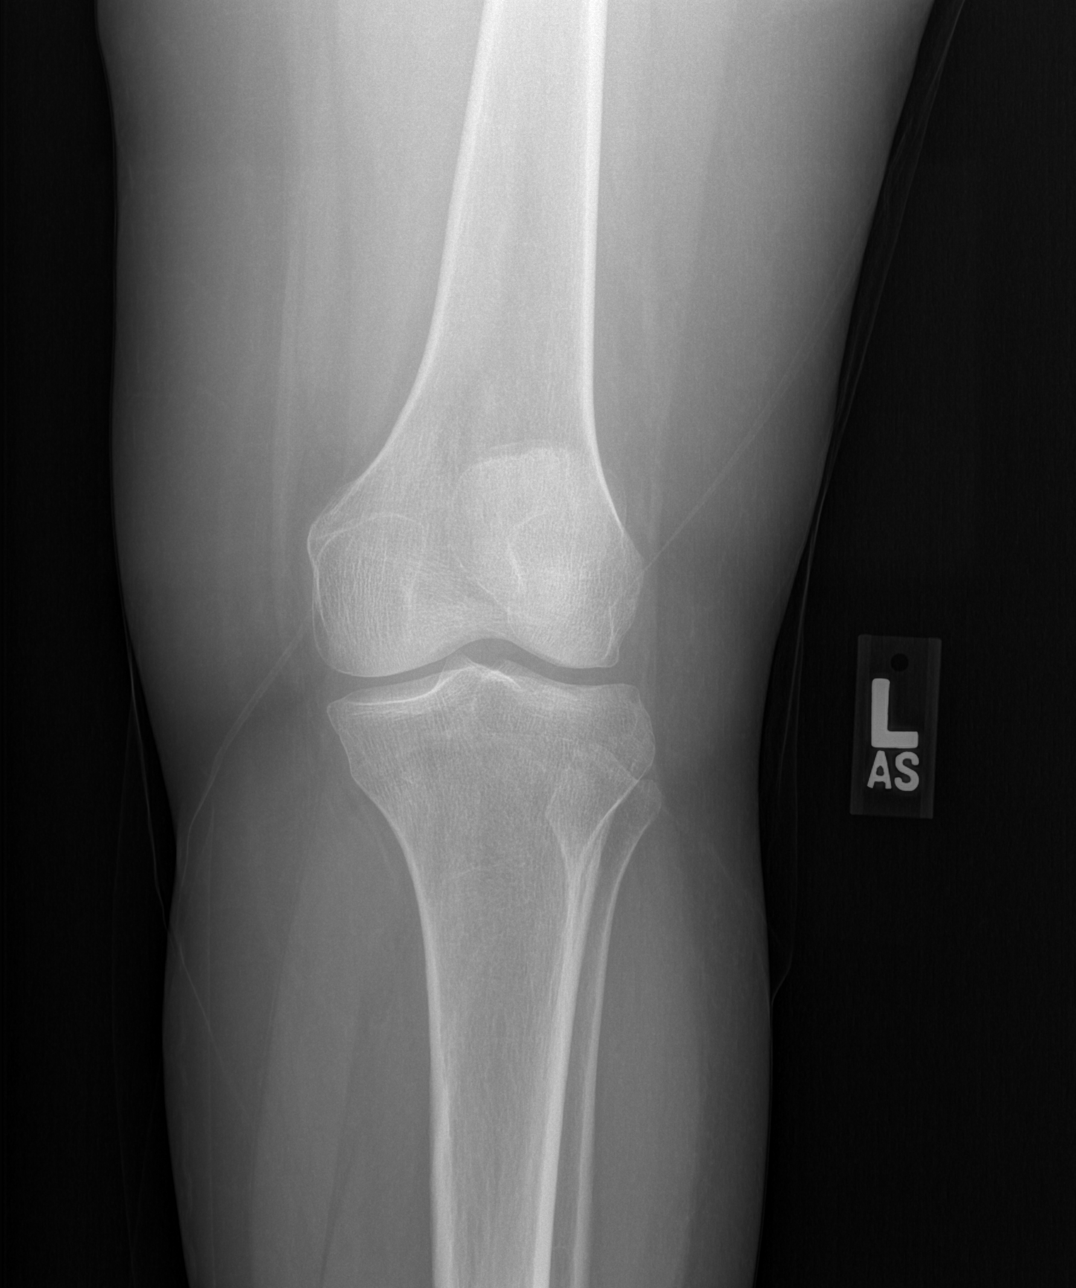

[t knee lat left]
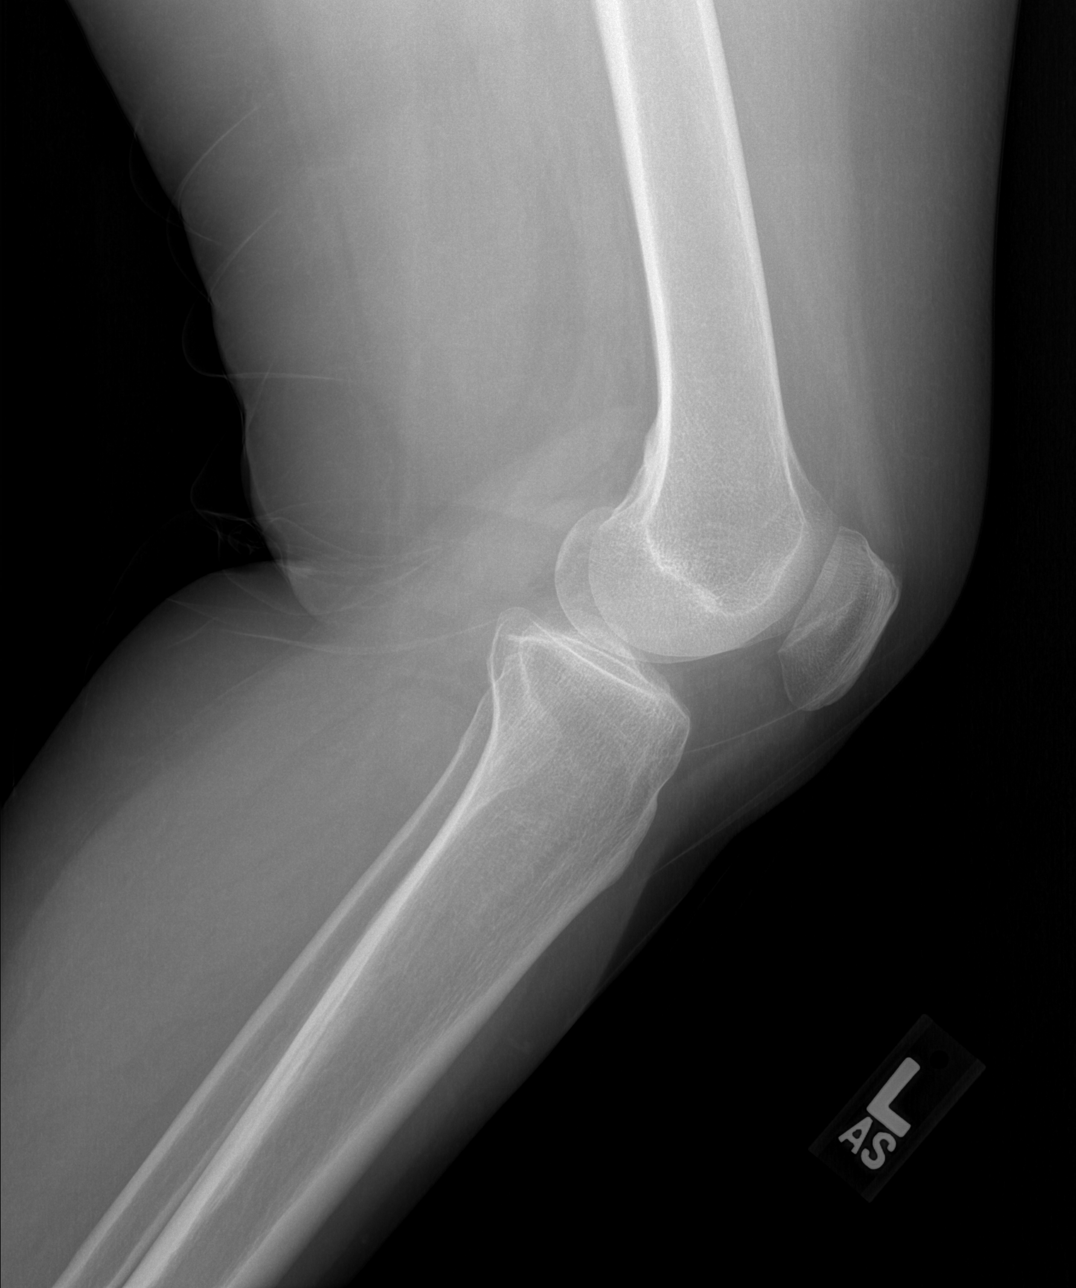

[4 of 4 positions shown; findings below may reference images not displayed]

FINDINGS: Frontal, lateral, and bilateral oblique views were obtained. No
fracture or dislocation. No joint effusion. Joint spaces appear
normal. No erosive change.
IMPRESSION: No fracture, dislocation, or joint effusion. No evident arthropathy.

## 2024-10-08 DIAGNOSIS — I10 Essential (primary) hypertension: Secondary | ICD-10-CM | POA: Diagnosis not present

## 2024-10-08 DIAGNOSIS — M8588 Other specified disorders of bone density and structure, other site: Secondary | ICD-10-CM | POA: Diagnosis not present

## 2024-10-08 DIAGNOSIS — E785 Hyperlipidemia, unspecified: Secondary | ICD-10-CM | POA: Diagnosis not present

## 2024-10-08 DIAGNOSIS — D509 Iron deficiency anemia, unspecified: Secondary | ICD-10-CM | POA: Diagnosis not present

## 2024-10-11 ENCOUNTER — Other Ambulatory Visit: Payer: Self-pay | Admitting: Family Medicine

## 2024-10-11 DIAGNOSIS — R351 Nocturia: Secondary | ICD-10-CM

## 2024-10-11 DIAGNOSIS — M542 Cervicalgia: Secondary | ICD-10-CM

## 2024-10-28 ENCOUNTER — Encounter: Payer: Self-pay | Admitting: Family Medicine

## 2024-10-29 ENCOUNTER — Encounter: Payer: Self-pay | Admitting: Family Medicine

## 2024-10-29 DIAGNOSIS — D509 Iron deficiency anemia, unspecified: Secondary | ICD-10-CM | POA: Diagnosis not present

## 2024-10-29 DIAGNOSIS — Z860100 Personal history of colon polyps, unspecified: Secondary | ICD-10-CM | POA: Diagnosis not present

## 2024-11-01 ENCOUNTER — Encounter: Payer: Self-pay | Admitting: Family Medicine

## 2024-11-02 ENCOUNTER — Encounter: Payer: Self-pay | Admitting: Family Medicine

## 2024-11-03 ENCOUNTER — Other Ambulatory Visit

## 2024-12-24 ENCOUNTER — Encounter: Payer: Self-pay | Admitting: Cardiology

## 2024-12-24 ENCOUNTER — Other Ambulatory Visit (HOSPITAL_COMMUNITY): Payer: Self-pay

## 2024-12-24 ENCOUNTER — Ambulatory Visit: Attending: Cardiology | Admitting: Cardiology

## 2024-12-24 VITALS — BP 142/82 | HR 69 | Ht 60.0 in | Wt 165.8 lb

## 2024-12-24 DIAGNOSIS — R400 Somnolence: Secondary | ICD-10-CM | POA: Diagnosis not present

## 2024-12-24 DIAGNOSIS — E7800 Pure hypercholesterolemia, unspecified: Secondary | ICD-10-CM

## 2024-12-24 DIAGNOSIS — I10 Essential (primary) hypertension: Secondary | ICD-10-CM

## 2024-12-24 DIAGNOSIS — R0683 Snoring: Secondary | ICD-10-CM | POA: Diagnosis not present

## 2024-12-24 MED ORDER — SPIRONOLACTONE 25 MG PO TABS
25.0000 mg | ORAL_TABLET | Freq: Every day | ORAL | 0 refills | Status: AC
  Filled 2024-12-24: qty 90, 90d supply, fill #0

## 2024-12-24 NOTE — Progress Notes (Unsigned)
 " Cardiology Office Note:  .   Date:  12/26/2024  ID:  Melissa Duran, DOB 1956-06-02, MRN [id number] PCP: Teresa Channel, MD  [facility] Providers Cardiologist:  Gordy Bergamo, MD   History of Present Illness: .   Melissa Duran is a 69 y.o. African-American female patient with hypertension, hypercholesterolemia, paradoxical stroke SP PFO closure in 2005, history of GI bleed in the remote past due to duodenal ulcers referred to me for evaluation of difficult to control hypertension.  Her main complaint is severe daytime somnolence and fatigue which limits her from trying to implement an exercise program.  States that she gives out so easily and many people have asked her if she is okay during the daytime.  Denies chest pain, dyspnea.  She has not had any further TIA or strokelike symptoms since PFO closure.    Discussed the use of AI scribe software for clinical note transcription with the patient, who gave verbal consent to proceed.  History of Present Illness Melissa Duran is a 69 year old female with hypertension who presents with uncontrolled blood pressure.  She has persistent elevated blood pressure at office visits despite taking lisinopril/HCT 20/25 mg and propranolol  120 mg daily as prescribed. She feels her blood pressure is always just up a little.  Her migraines have improved and are not a major issue. She uses propranolol  for both migraine and blood pressure control.  She recently started going to the gym and aims for 7,000 steps daily. 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  She has significant daytime sleepiness despite what she considers a full night's sleep. She wakes up tired, sometimes naps in her car after work, and can sleep until midday on days off yet still feels sleepy. She does not usually snore and has never had a sleep study.  She takes lovastatin for cholesterol.  She had a stroke attributed to a patent foramen ovale, which was closed in 2006, and has had no recurrent strokes.  She notes rare bedwetting about once every four to five months.   Cardiac Studies relevent.       EKG:   EKG Interpretation Date/Time:  Friday December 24 2024 14:18:59 EST Ventricular Rate:  69 PR Interval:  194 QRS Duration:  82 QT Interval:  388 QTC Calculation: 415 R Axis:   -3  Text Interpretation: EKG 12/24/2024: Normal sinus rhythm at rate of 69 bpm, LVH by voltage.  No evidence of ischemia.  Compared to 04/24/2008, LVH criteria new and first-degree AV block not present.  Normal QTc. Confirmed by Benjamin Merrihew, Jagadeesh [phone]) on 12/24/2024 2:23:27 PM  Labs   Care everywhere/Faxed External Labs:  Labs 10/08/2024:  Hb 11.7/HCT 36.6, platelets 337.  Serum glucose 85 mg, BUN 16, creatinine 0.8, eGFR 71 mL, potassium 4.4.  Ferritin normal at 254.  Vitamin D  normal at 35.3.  Total cholesterol 167, triglycerides 59, HDL 57, LDL 99.  Non-HDL cholesterol 110.  ROS  Review of Systems  Constitutional: Positive for malaise/fatigue (daytime).  Cardiovascular:  Negative for chest pain, dyspnea on exertion and leg swelling.  Respiratory:  Positive for snoring.    Physical Exam:   VS:  BP (!) 142/82 (BP Location: Left Arm, Cuff Size: Large)   Pulse 69   Ht 5' (1.524 m)   Wt 165 lb 12.8 oz (75.2 kg)   LMP 03/14/1998   SpO2 96%  BMI 32.38 kg/m    Wt Readings from Last 3 Encounters:  12/24/24 165 lb 12.8 oz (75.2 kg)  10/09/20 164 lb (74.4 kg)  10/05/19 165 lb (74.8 kg)    BP Readings from Last 3 Encounters:  12/24/24 (!) 142/82  10/09/20 140/86  10/05/19 120/78   Physical Exam Neck:     Vascular: No carotid bruit or JVD.  Cardiovascular:     Rate and Rhythm: Normal rate and regular rhythm.     Pulses: Intact distal pulses.     Heart sounds: Normal heart sounds. No murmur heard.    No gallop.  Pulmonary:     Effort: Pulmonary effort is normal.      Breath sounds: Normal breath sounds.  Abdominal:     General: Bowel sounds are normal.     Palpations: Abdomen is soft.  Musculoskeletal:     Right lower leg: No edema.     Left lower leg: No edema.     ASSESSMENT AND PLAN: .      ICD-10-CM   1. Primary hypertension  I10 EKG 12-Lead    spironolactone  (ALDACTONE ) 25 MG tablet    Basic metabolic panel with GFR    Basic metabolic panel with GFR    2. Pure hypercholesterolemia  E78.00     3. Uncontrolled daytime somnolence  R40.0     4. Snoring  R06.83      Assessment & Plan Primary hypertension Hypertension remains uncontrolled despite current medication regimen. Blood pressure is consistently elevated during medical visits. Current medications include lisinopril HCT 20/25 mg and propranolol  120 mg daily. Suspected sleep apnea may be contributing to elevated blood pressure. - Prescribed spironolactone  to aid in blood pressure control. - Will schedule follow-up in four weeks to assess blood pressure control and kidney function. - She has been referred to urologist for further evaluation of nocturnal enuresis.  Suspected obstructive sleep apnea Suspected due to symptoms of daytime somnolence, fatigue, and unrefreshing sleep. No prior sleep study conducted. Sleep apnea may be contributing to hypertension and fatigue. - Referred to Dr. Montie Pizza for a sleep study. - Advised to discuss with insurance for prior authorization for the sleep study. - Educated on the importance of CPAP therapy if sleep apnea is confirmed, emphasizing adherence for at least six to eight weeks to assess effectiveness.  Pure hypercholesterolemia Cholesterol levels are well controlled with current medication regimen. LDL is less than 100 mg/dL and HDL is 57 mg/dL.  Obesity BMI is 32, indicating obesity. Current weight loss efforts include gym attendance and increased physical activity. Sleep apnea and fatigue may be hindering weight loss efforts. -  Encouraged continued physical activity and lifestyle modifications. - Will reassess weight management strategies after addressing sleep apnea and blood pressure control.  Follow up: 4-6 weeks with APP. BP and BMP follow up. May need Amlodipine and if stable, see us  PRN  Signed,  Gordy Bergamo, MD, [facility] 12/26/2024, 9:53 AM [facility] [address] [place], [place] [address] Phone: [phone]. Fax:  [phone]  "

## 2024-12-24 NOTE — Patient Instructions (Signed)
 Medication Instructions:  START  spironolactone  (ALDACTONE ) 25 MG tablet         Take 1 tablet (25 mg total) by mouth daily   *If you need a refill on your cardiac medications before your next appointment, please call your pharmacy*  Lab Work: ( in 2 weeks) Lab Orders         Basic metabolic panel with GFR     If you have labs (blood work) drawn today and your tests are completely normal, you will receive your results only by: MyChart Message (if you have MyChart) OR A paper copy in the mail If you have any lab test that is abnormal or we need to change your treatment, we will call you to review the results.   Follow-Up: At [facility], you and your health needs are our priority.  As part of our continuing mission to provide you with exceptional heart care, our providers are all part of one team.  This team includes your primary Cardiologist (physician) and Advanced Practice Providers or APPs (Physician Assistants and Nurse Practitioners) who all work together to provide you with the care you need, when you need it.  Your next appointment:   4-6 week(s)  Provider:   Gordy Bergamo, MD   We recommend signing up for the patient portal called MyChart.  Sign up information is provided on this After Visit Summary.  MyChart is used to connect with patients for Virtual Visits (Telemedicine).  Patients are able to view lab/test results, encounter notes, upcoming appointments, etc.  Non-urgent messages can be sent to your provider as well.   To learn more about what you can do with MyChart, go to [email or web address].           We recommend signing up for the patient portal called MyChart.  Patients are able to view lab/test results, encounter notes, upcoming appointments, etc.  Non-urgent messages can be sent to your provider as well, go to [email or web address].

## 2025-01-07 ENCOUNTER — Ambulatory Visit: Admitting: Cardiology

## 2025-01-12 ENCOUNTER — Ambulatory Visit: Payer: Self-pay | Admitting: Cardiology

## 2025-01-12 DIAGNOSIS — I10 Essential (primary) hypertension: Secondary | ICD-10-CM

## 2025-01-12 LAB — BASIC METABOLIC PANEL WITH GFR
BUN/Creatinine Ratio: 28 (ref 12–28)
BUN: 47 mg/dL — ABNORMAL HIGH (ref 8–27)
CO2: 18 mmol/L — ABNORMAL LOW (ref 20–29)
Calcium: 10 mg/dL (ref 8.7–10.3)
Chloride: 105 mmol/L (ref 96–106)
Creatinine, Ser: 1.66 mg/dL — ABNORMAL HIGH (ref 0.57–1.00)
Glucose: 87 mg/dL (ref 70–99)
Potassium: 4.7 mmol/L (ref 3.5–5.2)
Sodium: 140 mmol/L (ref 134–144)
eGFR: 34 mL/min/{1.73_m2} — ABNORMAL LOW

## 2025-01-12 NOTE — Progress Notes (Signed)
 Your kidney function has decreased due to addition of spironolactone .  I would like you to reduce the dose of lisinopril HCT by 1/2 tablet daily and we will recheck BMP in 10 days.  Labs have been ordered.   ICD-10-CM   1. Primary hypertension  I10 Basic metabolic panel with GFR     Orders Placed This Encounter  Procedures   Basic metabolic panel with GFR
